# Patient Record
Sex: Female | Born: 1970 | Race: White | Hispanic: No | Marital: Married | Smoking: Never smoker
Health system: Southern US, Community
[De-identification: ages and names within clinical notes are randomized; demographics above are authoritative.]

## PROBLEM LIST (undated history)

## (undated) DIAGNOSIS — C4491 Basal cell carcinoma of skin, unspecified: Secondary | ICD-10-CM

## (undated) HISTORY — PX: TUBAL LIGATION: SHX77

## (undated) HISTORY — DX: Basal cell carcinoma of skin, unspecified: C44.91

---

## 2001-01-11 ENCOUNTER — Other Ambulatory Visit: Admission: RE | Admit: 2001-01-11 | Discharge: 2001-01-11 | Payer: Self-pay | Admitting: *Deleted

## 2002-01-22 ENCOUNTER — Other Ambulatory Visit: Admission: RE | Admit: 2002-01-22 | Discharge: 2002-01-22 | Payer: Self-pay | Admitting: Obstetrics and Gynecology

## 2002-06-12 ENCOUNTER — Ambulatory Visit (HOSPITAL_COMMUNITY): Admission: RE | Admit: 2002-06-12 | Discharge: 2002-06-12 | Payer: Self-pay | Admitting: Gastroenterology

## 2002-06-13 ENCOUNTER — Encounter (INDEPENDENT_AMBULATORY_CARE_PROVIDER_SITE_OTHER): Payer: Self-pay | Admitting: *Deleted

## 2002-06-13 ENCOUNTER — Ambulatory Visit (HOSPITAL_COMMUNITY): Admission: RE | Admit: 2002-06-13 | Discharge: 2002-06-13 | Payer: Self-pay | Admitting: Gastroenterology

## 2003-02-04 ENCOUNTER — Other Ambulatory Visit: Admission: RE | Admit: 2003-02-04 | Discharge: 2003-02-04 | Payer: Self-pay | Admitting: Obstetrics & Gynecology

## 2003-08-15 ENCOUNTER — Observation Stay (HOSPITAL_COMMUNITY): Admission: AD | Admit: 2003-08-15 | Discharge: 2003-08-16 | Payer: Self-pay | Admitting: Gynecology

## 2003-09-09 ENCOUNTER — Encounter (INDEPENDENT_AMBULATORY_CARE_PROVIDER_SITE_OTHER): Payer: Self-pay | Admitting: Specialist

## 2003-09-09 ENCOUNTER — Inpatient Hospital Stay (HOSPITAL_COMMUNITY): Admission: AD | Admit: 2003-09-09 | Discharge: 2003-09-12 | Payer: Self-pay | Admitting: Gynecology

## 2003-10-22 ENCOUNTER — Other Ambulatory Visit: Admission: RE | Admit: 2003-10-22 | Discharge: 2003-10-22 | Payer: Self-pay | Admitting: Gynecology

## 2004-07-16 ENCOUNTER — Emergency Department (HOSPITAL_COMMUNITY): Admission: EM | Admit: 2004-07-16 | Discharge: 2004-07-16 | Payer: Self-pay | Admitting: Emergency Medicine

## 2004-10-22 ENCOUNTER — Other Ambulatory Visit: Admission: RE | Admit: 2004-10-22 | Discharge: 2004-10-22 | Payer: Self-pay | Admitting: Gynecology

## 2005-08-30 ENCOUNTER — Other Ambulatory Visit: Admission: RE | Admit: 2005-08-30 | Discharge: 2005-08-30 | Payer: Self-pay | Admitting: Gynecology

## 2005-09-06 IMAGING — US US FETAL BPP W/O NONSTRESS
1 series · 14 of 22 positions shown · non-contrast
Comparison: none

CLINICAL DATA: UTI with contractions.  Assess fetal well-being.

[Series 1: unknown · 0.41mm/px · 14 of 22 slices shown]
[im 1/22]
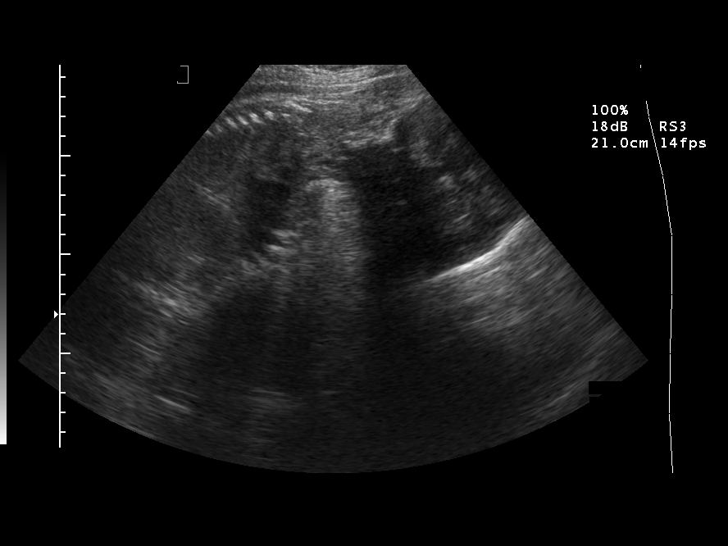
[im 3/22]
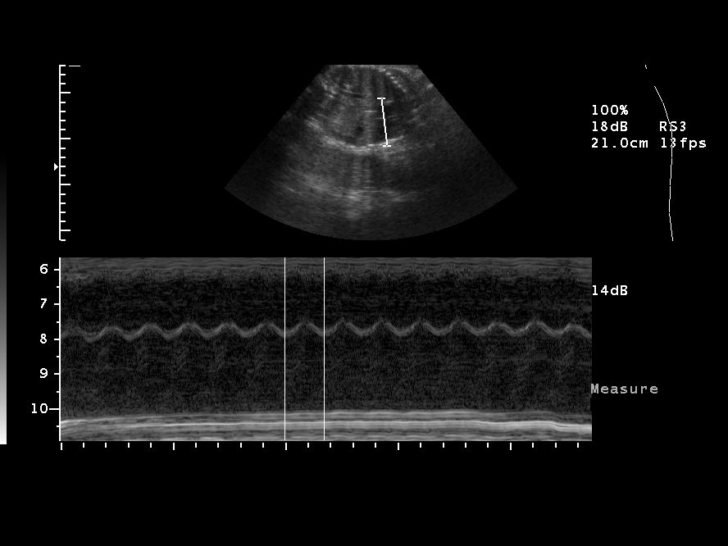
[im 4/22]
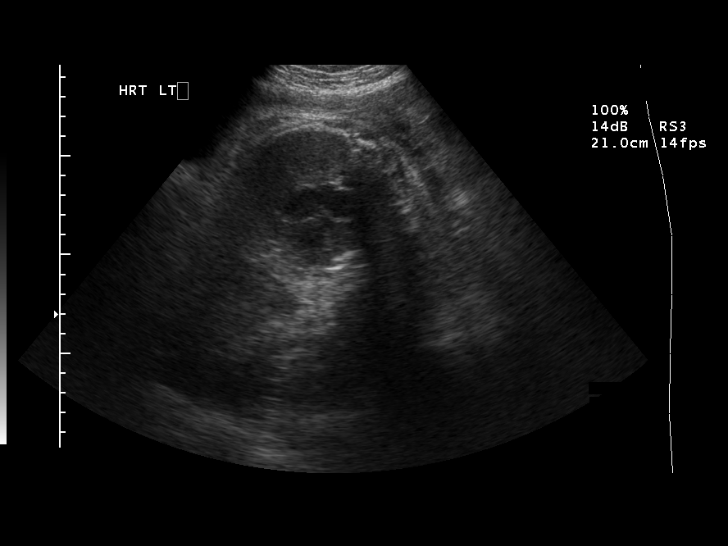
[im 6/22]
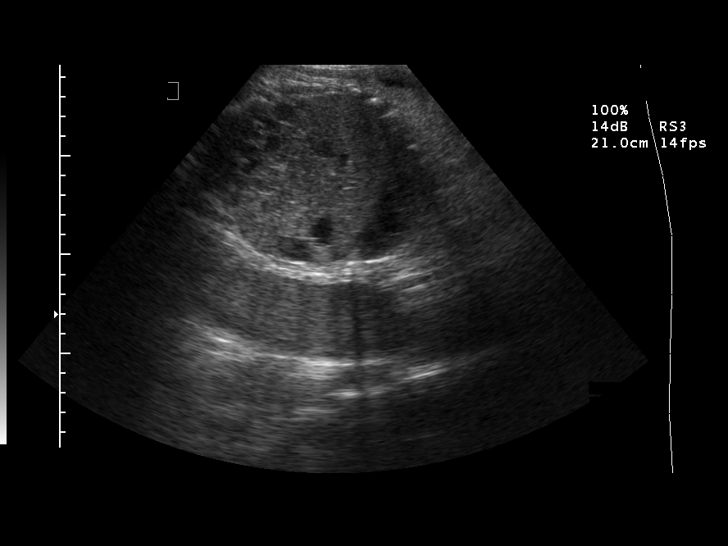
[im 8/22]
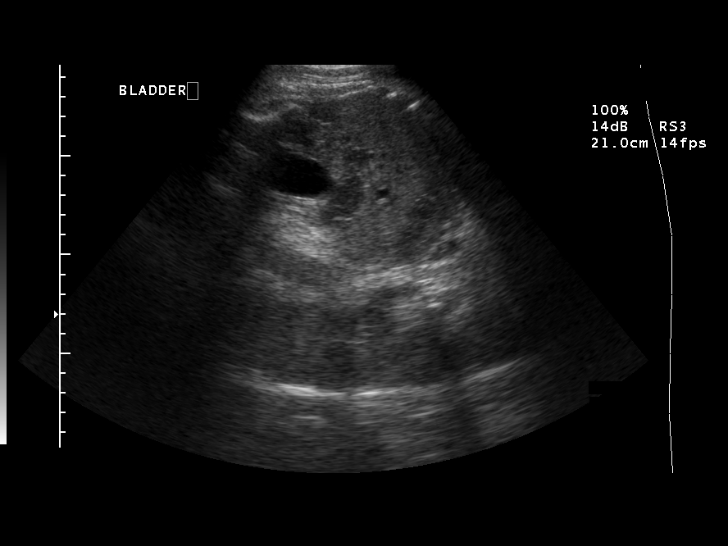
[im 9/22]
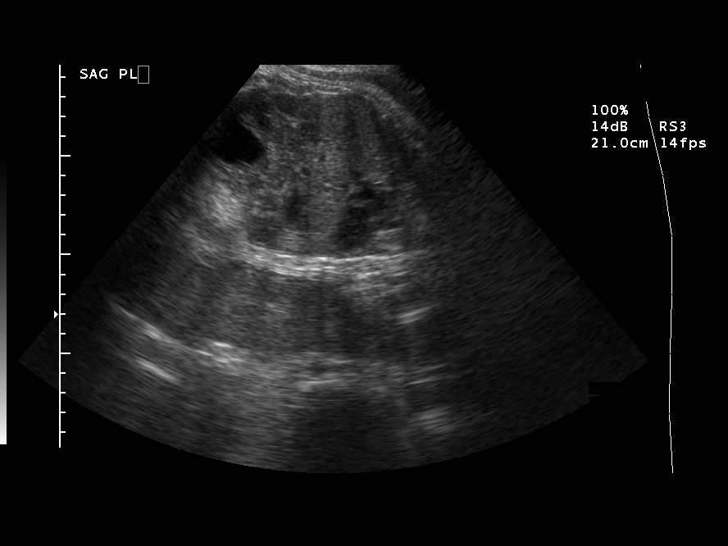
[im 11/22]
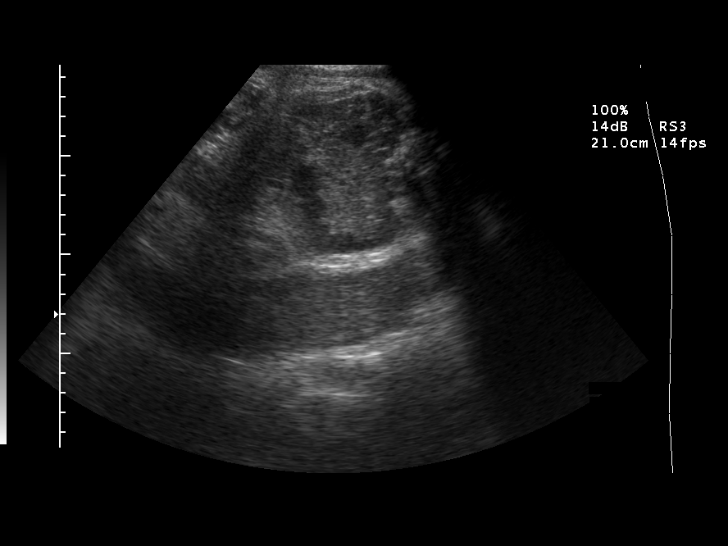
[im 12/22]
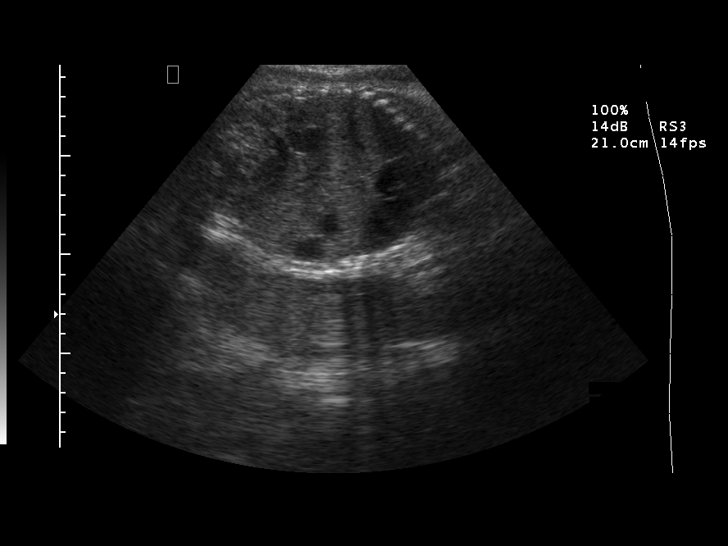
[im 14/22]
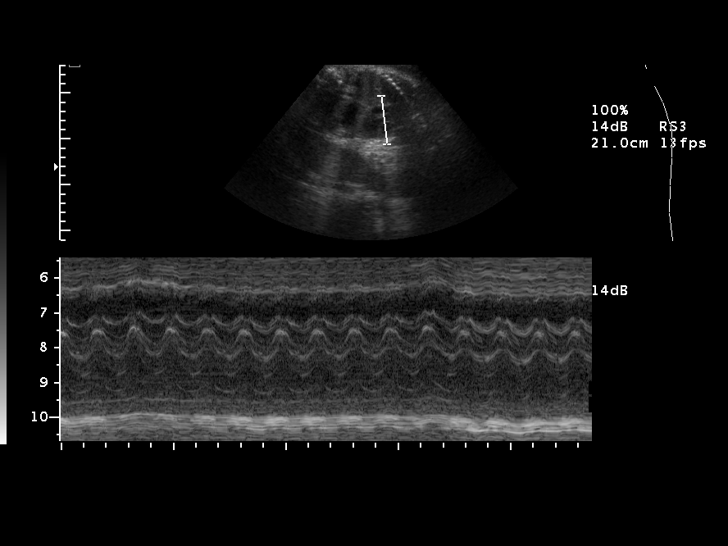
[im 15/22]
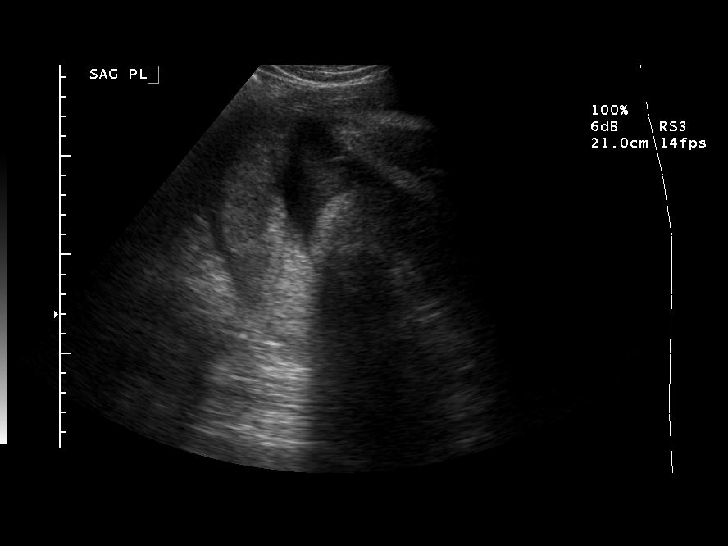
[im 17/22]
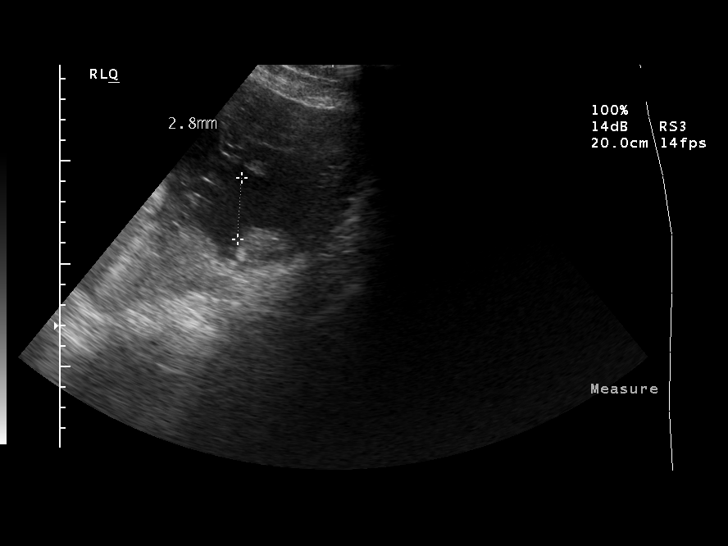
[im 19/22]
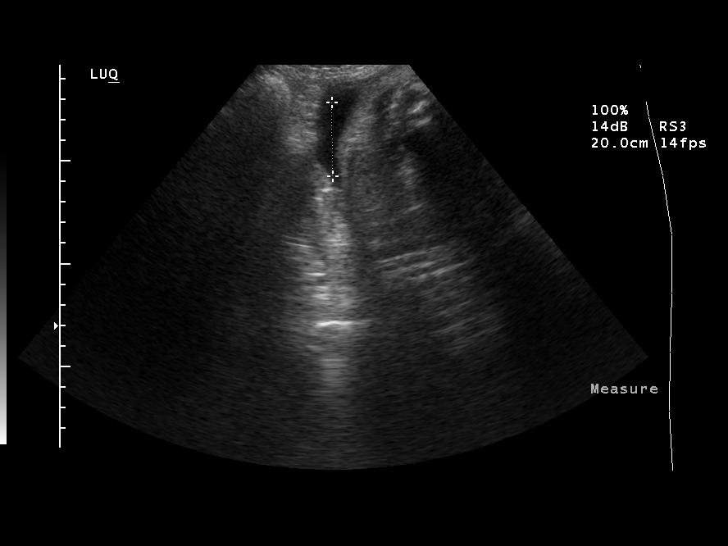
[im 20/22]
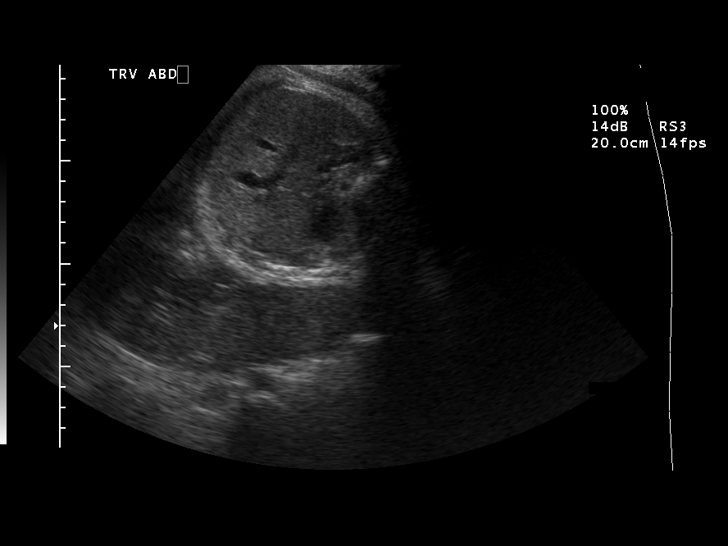
[im 22/22]
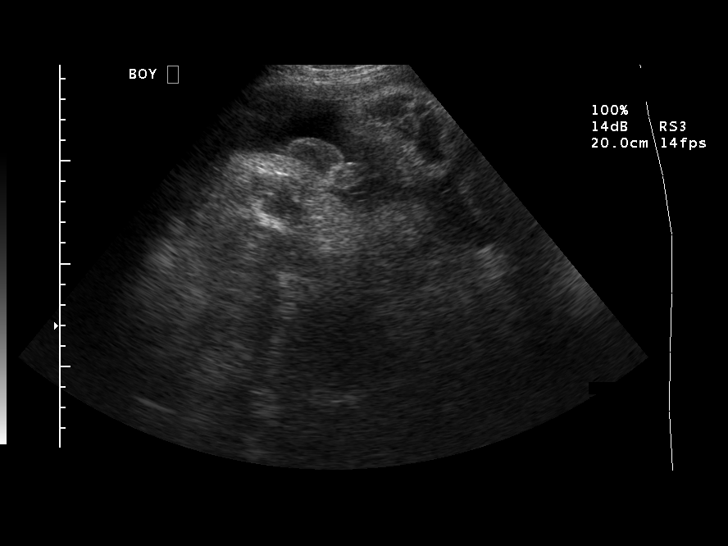

[14 of 22 positions shown; findings below may reference images not displayed]

BIOPHYSICAL PROFILE

Number of Fetuses:  1
Heart rate:  142
Movement:  Yes
Breathing: Yes
Presentation:  Cephalic
Placental Location:  Posterior
Grade:  II
Previa:  No
Amniotic Fluid (Subjective):  Normal
Amniotic Fluid (Objective):  12.0 cm AFI (5th -95th%ile =   7.7 ? 24.9 cm for 36 wks)

Fetal measurements and complete anatomic evaluation were not requested.  The following fetal anatomy was visualized on this exam:  Stomach, kidneys, bladder, diaphragm and male genitalia

BPP SCORING
Movements:  2  Time:  25 minutes
Breathing:  2
Tone:  2
Amniotic Fluid:  2
Total Score:  8

MATERNAL FINDINGS:
Cervix:  Not evaluated

IMPRESSION
Biophysical profile score [DATE].
Subjectively and quantitatively normal amniotic fluid.

## 2006-03-04 ENCOUNTER — Inpatient Hospital Stay (HOSPITAL_COMMUNITY): Admission: AD | Admit: 2006-03-04 | Discharge: 2006-03-07 | Payer: Self-pay | Admitting: Gynecology

## 2006-03-04 ENCOUNTER — Encounter (INDEPENDENT_AMBULATORY_CARE_PROVIDER_SITE_OTHER): Payer: Self-pay | Admitting: Specialist

## 2006-04-19 ENCOUNTER — Other Ambulatory Visit: Admission: RE | Admit: 2006-04-19 | Discharge: 2006-04-19 | Payer: Self-pay | Admitting: Gynecology

## 2006-04-29 ENCOUNTER — Encounter: Admission: RE | Admit: 2006-04-29 | Discharge: 2006-05-11 | Payer: Self-pay | Admitting: Gynecology

## 2007-04-21 ENCOUNTER — Other Ambulatory Visit: Admission: RE | Admit: 2007-04-21 | Discharge: 2007-04-21 | Payer: Self-pay | Admitting: Gynecology

## 2008-04-22 ENCOUNTER — Encounter: Payer: Self-pay | Admitting: Women's Health

## 2008-04-22 ENCOUNTER — Other Ambulatory Visit: Admission: RE | Admit: 2008-04-22 | Discharge: 2008-04-22 | Payer: Self-pay | Admitting: Gynecology

## 2008-04-22 ENCOUNTER — Ambulatory Visit: Payer: Self-pay | Admitting: Women's Health

## 2009-04-23 ENCOUNTER — Encounter: Payer: Self-pay | Admitting: Women's Health

## 2009-04-23 ENCOUNTER — Other Ambulatory Visit: Admission: RE | Admit: 2009-04-23 | Discharge: 2009-04-23 | Payer: Self-pay | Admitting: Gynecology

## 2009-04-23 ENCOUNTER — Ambulatory Visit: Payer: Self-pay | Admitting: Women's Health

## 2010-04-06 ENCOUNTER — Inpatient Hospital Stay (HOSPITAL_COMMUNITY): Admission: EM | Admit: 2010-04-06 | Discharge: 2010-04-10 | Payer: Self-pay | Admitting: Gastroenterology

## 2010-04-09 ENCOUNTER — Ambulatory Visit: Payer: Self-pay | Admitting: Infectious Disease

## 2010-04-16 ENCOUNTER — Encounter: Payer: Self-pay | Admitting: Infectious Disease

## 2010-04-30 ENCOUNTER — Ambulatory Visit: Payer: Self-pay | Admitting: Infectious Disease

## 2010-04-30 DIAGNOSIS — B259 Cytomegaloviral disease, unspecified: Secondary | ICD-10-CM | POA: Insufficient documentation

## 2010-04-30 DIAGNOSIS — M129 Arthropathy, unspecified: Secondary | ICD-10-CM | POA: Insufficient documentation

## 2010-04-30 DIAGNOSIS — K5289 Other specified noninfective gastroenteritis and colitis: Secondary | ICD-10-CM

## 2010-04-30 DIAGNOSIS — K5190 Ulcerative colitis, unspecified, without complications: Secondary | ICD-10-CM | POA: Insufficient documentation

## 2010-08-20 ENCOUNTER — Ambulatory Visit: Admit: 2010-08-20 | Payer: Self-pay | Admitting: Women's Health

## 2010-08-25 NOTE — Miscellaneous (Signed)
Summary: HIPAA Restrictions  HIPAA Restrictions   Imported By: Florinda Marker 05/01/2010 16:17:56  _____________________________________________________________________  External Attachment:    Type:   Image     Comment:   External Document

## 2010-08-25 NOTE — Assessment & Plan Note (Signed)
Summary: HFU/CMV Colitis/Sept ID Svc @ [facility]   Visit Type:  Follow-up Primary Nicholous Girgenti:  Dr. Loreta Ave  CC:  HFU/CMV Colitis.  History of Present Illness: 38-yo with Ulcerative colitis, dx in 2003 on chornic mesalamine, and more recently corticosteroids.  She had been on 60 mg of prednisone for 2 weeks prior to admission, and prior to that   had been on at least 30 mg of prednisone for several weeks.  She was admitted on September 12th with severe diarrhea and abdominal pain.   Colonoscopy was done which revealed severe pan colitis and large ulcers.  Biopsies were taken and the patient was started on corticosteroids therapy.  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 Her CMV igM came back positive along with CMV igg, CMV pCR blood negative. THe bx site had not been sent for viral cutlrue or pcr. She feels genearlly well and is having her prednisone tapered. She plans to return to work in December. She previously did work with young infants with developmental delay and was concerned about risk of exposing them to CMV. Apparently she will not be as involed in medically vulnerable pts in future. I also offered her flu shot but she refused this even though I explained that the inactive vaccine could not give her the flu and would protect her and her children (who do get the vaccine). She generally feels much better only having 3 bm per day that are smaller than what is normal for her. She has minimal abdominal pain. She had flare of arthiritc pain in knees a few weeks ago that she wonders might have been related to her UC.    Problems Prior to Update: None  Current Medications (verified): 1)  Valcyte 450 Mg Tabs (Valganciclovir Hcl) .... Two Tablets Three Times A Day 2)  Lialda 1.2 Gm Tbec (Mesalamine) 3)  Prednisone 10 Mg Tabs (Prednisone)  Allergies (verified): 1)  ! Flagyl   Preventive Screening-Counseling & Management  Alcohol-Tobacco     Alcohol  drinks/day: occassionally     Smoking Status: never  Caffeine-Diet-Exercise     Caffeine use/day: coffee,tea and sodas     Does Patient Exercise: yes     Type of exercise: walking and playing  with her boys outside  Safety-Violence-Falls     Seat Belt Use: yes   Current Allergies (reviewed today): ! FLAGYL Past History:  Past Medical History:  1. Ulceritive colitis in 2003.   2. Migraine headaches.   3. Prior history of Clostridium difficile colitis x1.  4. CMV colitis as described above  Past Surgical History: non recent surgeries  Family History: noncontirbutory  Social History: nonsmoker, reare alcohol. lives with husband and two young children  Review of Systems  The patient denies anorexia, fever, weight loss, weight gain, vision loss, decreased hearing, hoarseness, chest pain, syncope, dyspnea on exertion, peripheral edema, prolonged cough, headaches, hemoptysis, abdominal pain, melena, hematochezia, severe indigestion/heartburn, hematuria, incontinence, genital sores, muscle weakness, suspicious skin lesions, transient blindness, difficulty walking, depression, unusual weight change, abnormal bleeding, and enlarged lymph nodes.    Vital Signs:  Patient profile:   40 year old female Height:      60 inches (152.40 cm) Weight:      116.4 pounds (52.91 kg) BMI:     22.81 Temp:     98.2 degrees F (36.78 degrees C) oral Pulse rate:   102 / minute BP sitting:   93 / 64  (left arm)  Vitals  Entered By: Baxter Hire) (April 30, 2010 4:03 PM) CC: HFU/CMV Colitis Pain Assessment Patient in pain? no      Nutritional Status BMI of 19 -24 = normal Nutritional Status Detail appetite is good per patient  Does patient need assistance? Functional Status Self care Ambulation Normal   Physical Exam  General:  alert.   Head:  normocephalic, atraumatic, and no abnormalities observed.   Eyes:  vision grossly intact, pupils equal, and pupils round.   Ears:  no  external deformities and ear piercing(s) noted.   Nose:  no external deformity and no external erythema.   Mouth:  pharynx pink and moist and no erythema.   Neck:  supple and full ROM.   Lungs:  normal respiratory effort and no crackles.   Heart:  normal rate, regular rhythm, no murmur, no gallop, and no rub.   Abdomen:  protuberant minimal teenderness to palpation fo epigastric area, positive bs Msk:  normal ROM and no joint deformities.   Extremities:  No clubbing, cyanosis, edema, or deformity noted with normal full range of motion of all joints.   Neurologic:  alert & oriented X3, strength normal in all extremities, and gait normal.   Skin:  color normal and no rashes.   Psych:  Oriented X3, memory intact for recent and remote, and normally interactive.     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 Hoepfully this will not recur, though it is certainly possible, particulary if her immunosuppressives are intensified to treat the UC (which will likely happen again) Orders: Est. Patient Level IV (16109)  Problem # 2:  ULCERATIVE COLITIS (ICD-556.9) does not seem to be active at present though arthritic pain could have been an extraintestinal manifestaion Orders: Est. Patient Level IV (60454)  Problem # 3:  ARTHRITIS, KNEES, BILATERAL (ICD-716.98) this could have been extaintestianl manifestaions of UC  Medications Added to Medication List This Visit: 1)  Valcyte 450 Mg Tabs (Valganciclovir hcl) .... Two tablets three times a day 2)  Lialda 1.2 Gm Tbec (Mesalamine) 3)  Prednisone 10 Mg Tabs (Prednisone)

## 2010-10-07 NOTE — Discharge Summary (Signed)
  NAMESHAKENA, CALLARI              ACCOUNT NO.:  [id number]  MEDICAL RECORD NO.:  [id number]          PATIENT TYPE:  INP  LOCATION:  5041                         FACILITY:  [facility]  PHYSICIAN:  Anselmo Rod, MD, FACGDATE OF BIRTH:  08-Aug-1970  DATE OF ADMISSION:  04/06/2010 DATE OF DISCHARGE:  04/10/2010                              DISCHARGE SUMMARY   ADMITTING DIAGNOSES: 1. Severe ulcerative colitis flare-up. 2. Cytomegalovirus colitis. 3. Migraine headaches. 4. Past history of Clostridium difficile colitis superimposed on     ulcerative colitis.  DISCHARGE SUMMARY: 1. Severe ulcerative colitis. 2. Cytomegalovirus colitis. 3. Migraine headaches.  HOSPITAL COURSE:  Ms. Corona Popovich is a 40 year old Caucasian female, who was admitted from the office to [facility] for severe ulcerative colitis flare.  She was on a course of prednisone for several weeks and as her diarrhea did not get better she had an emergent colonoscopy that revealed pancolitis with friability, bleeding, and severe ulceration.  Therefore, she was hospitalized and Infectious Disease consultation was procured from Dr. Acey Lav on April 09, 2010.  At the time of admission, she was on Benadryl, Mesalamine, prednisone, Florastor, and morphine sulfate.  On Dr. Zenaida Niece Dam's recommendation, she was started on Valacyclovir 900 mg twice a day for a total of 21 days.  The patient's symptoms improved significantly prior to discharge and she was discharged home April 10, 2010.  DISCHARGE MEDICATIONS: 1. Valcyte 500 mg b.i.d. p.o. for the next 2 weeks, a total of 21     days. 2. Lialda 1.2 g two pills b.i.d. 3. Florastor 1 pill b.i.d. 4. Canasa suppositories 1000 mg at bedtime.  DISCHARGE INSTRUCTIONS: 1. The patient was advised to follow a low residue soft diet and avoid     all nonsteroidals. 2. Outpatient followup was advised on April 21, 2010.  The patient     is to call the  office if she had any problems in the interim.     Anselmo Rod, MD, Clementeen Graham     JNM/MEDQ  D:  10/05/2010  T:  10/06/2010  Job:  [id number]  Electronically Signed by Charna Elizabeth M.D. on 10/07/2010 05:12:16 PM

## 2010-10-08 LAB — CBC
HCT: 36.3 % (ref 36.0–46.0)
MCH: 28.5 pg (ref 26.0–34.0)
MCH: 29.3 pg (ref 26.0–34.0)
MCHC: 33.9 g/dL (ref 30.0–36.0)
MCV: 84.3 fL (ref 78.0–100.0)
MCV: 84.4 fL (ref 78.0–100.0)
MCV: 86.8 fL (ref 78.0–100.0)
Platelets: 203 10*3/uL (ref 150–400)
Platelets: 238 10*3/uL (ref 150–400)
Platelets: 249 10*3/uL (ref 150–400)
RBC: 3.18 MIL/uL — ABNORMAL LOW (ref 3.87–5.11)
RDW: 12.5 % (ref 11.5–15.5)
RDW: 12.6 % (ref 11.5–15.5)
RDW: 12.7 % (ref 11.5–15.5)
WBC: 5.5 10*3/uL (ref 4.0–10.5)

## 2010-10-08 LAB — BASIC METABOLIC PANEL
BUN: 2 mg/dL — ABNORMAL LOW (ref 6–23)
BUN: 4 mg/dL — ABNORMAL LOW (ref 6–23)
BUN: 4 mg/dL — ABNORMAL LOW (ref 6–23)
CO2: 27 mEq/L (ref 19–32)
CO2: 30 mEq/L (ref 19–32)
Calcium: 7.8 mg/dL — ABNORMAL LOW (ref 8.4–10.5)
Calcium: 8.1 mg/dL — ABNORMAL LOW (ref 8.4–10.5)
Chloride: 101 mEq/L (ref 96–112)
Chloride: 104 mEq/L (ref 96–112)
Creatinine, Ser: 0.42 mg/dL (ref 0.4–1.2)
Creatinine, Ser: 0.44 mg/dL (ref 0.4–1.2)
Creatinine, Ser: 0.52 mg/dL (ref 0.4–1.2)
GFR calc Af Amer: >60 mL/min (ref >60)
GFR calc Af Amer: >60 mL/min (ref >60)
GFR calc non Af Amer: >60 mL/min (ref >60)
Glucose, Bld: 103 mg/dL — ABNORMAL HIGH (ref 70–99)
Potassium: 2.9 mEq/L — ABNORMAL LOW (ref 3.5–5.1)

## 2010-10-08 LAB — HEPATIC FUNCTION PANEL
ALT: 18 U/L (ref 0–35)
Alkaline Phosphatase: 49 U/L (ref 39–117)
Bilirubin, Direct: 0.2 mg/dL (ref 0.0–0.3)
Indirect Bilirubin: 0.7 mg/dL (ref 0.3–0.9)
Total Protein: 5 g/dL — ABNORMAL LOW (ref 6.0–8.3)

## 2010-10-08 LAB — DIFFERENTIAL
Basophils Absolute: 0 10*3/uL (ref 0.0–0.1)
Eosinophils Absolute: 0.1 10*3/uL (ref 0.0–0.7)
Eosinophils Relative: 1 % (ref 0–5)
Lymphocytes Relative: 18 % (ref 12–46)
Lymphs Abs: 1.3 10*3/uL (ref 0.7–4.0)
Monocytes Absolute: 0.5 10*3/uL (ref 0.1–1.0)

## 2010-10-08 LAB — CYTOMEGALOVIRUS ANTIBODY, IGG: Cytomegalovirus(CMV) Antibody, IgG: POSITIVE — AB

## 2010-10-08 LAB — CMV IGM: CMV IgM: 2.21 — ABNORMAL HIGH (ref <0.90)

## 2010-10-08 LAB — HAEMOPHILIUS INFLUENZAE B AB IGG: Influenza B Virus Ab, IgG: 0.21 ug/mL — ABNORMAL LOW (ref >=1.00)

## 2010-12-11 NOTE — H&P (Signed)
Angel Dean, Angel Dean              ACCOUNT NO.:  [id number]   MEDICAL RECORD NO.:  [id number]          PATIENT TYPE:  INP   LOCATION:  9174                          FACILITY[facility]  [facility]   PHYSICIAN:  Timothy P. Fontaine, M.D.DATE OF BIRTH:  January 08, 1971   DATE OF ADMISSION:  03/04/2006  DATE OF DISCHARGE:                                HISTORY & PHYSICAL   CHIEF COMPLAINT:  Labor, rupture of membranes.   HISTORY OF PRESENT ILLNESS:  The patient is a 40 year old, G60, P59 female at  [redacted] weeks gestation admitted with spontaneous rupture of membranes and  subsequent contractions.  The patient's prenatal course was uncomplicated.  She does have a history of a prior cesarean section for non-reassuring fetal  heart rate tracing and after counseling to the risks and benefits, has  elected for a trial of labor.  She understands and accepts the risks of  catastrophic uterine rupture.  She is beta Strep negative.  For the  remainder of her history, see her __________ .   PHYSICAL EXAMINATION:  VITAL SIGNS:  Afebrile, vital signs stable.  HEENT:  Normal.  LUNGS:  Clear.  CARDIAC:  Regular rate without rubs, murmurs or gallops.  ABDOMEN:  Gravid fundus consistent with term.  External monitors show a  reactive fetal tracing with contractions every 2-4 minutes.  PELVIC:  Cervix fingertip dilated, 50% effaced, -2 station.  No gross fluid  noted, but on triage admission, nursing reports copious, clear fluid.  IUPC  was placed.   ASSESSMENT AND PLAN:  A 40 year old, G2, P1, 38 weeks, spontaneous rupture  of membranes, subsequent contractions, prior cesarean section.  Counseled  for options to include trial of labor versus repeat cesarean section, who  elects for trial of labor.  She has been previously counseled as to the  risks to include catastrophic uterine rupture.  She understands and accepts  these.  An intrauterine pressure catheter was placed.  Will assess for labor  and augment with pitocin as  needed as discussed with the patient, who  understands and accepts.  She is beta Strep negative.      Timothy P. Fontaine, M.D.  Electronically Signed     TPF/MEDQ  D:  03/04/2006  T:  03/04/2006  Job:  [id number]

## 2010-12-11 NOTE — Discharge Summary (Signed)
Angel Dean, Angel Dean              ACCOUNT NO.:  [id number]   MEDICAL RECORD NO.:  [id number]          PATIENT TYPE:  INP   LOCATION:  NA                            [facility]AC[facility]  [facility]   PHYSICIAN:  Juan H. Lily Peer, M.D.DATE OF BIRTH:  1971-04-28   DATE OF ADMISSION:  03/04/2006  DATE OF DISCHARGE:  03/07/2006                                 DISCHARGE SUMMARY   HISTORY OF PRESENT ILLNESS:  The patient is a 40 year old gravida 4, now  para 2, who had been admitted at [redacted] weeks gestation with spontaneous rupture  of membranes and in labor. The patient had a benign prenatal course. She had  a prior history of cesarean section for non-reassuring fetal heart rate  tracing and was attempting a trial of labor. She underwent a repeat lower  uterine segment transverse cesarean section along with a bilateral tubal  sterilization procedure secondary to prolonged rupture of membranes and  arrest of first stage of labor and had requested elective sterilization. She  delivered a viable female infant with Apgar's of 9 and 9 with a weight of 7  pounds, clear amniotic fluid, normal maternal pelvic anatomy. Postoperative  day 1, her hemoglobin was 10.6, platelet count 167,000. She had been  afebrile with good urinary output. Her blood type was O positive. She was  Rubella immune. Her diet was advanced from clear to a regular diet. On  postoperative day 2, she was passing flatus, was up and ambulating and  tolerated a regular diet, and she was ready to be discharged home on her  third postoperative day. Her skin incision had been intact and it had been  closed with subcuticular stitches and she was ready to be discharged home.   FINAL DIAGNOSES:  1. Term intrauterine pregnancy, delivered.  2. Prolonged rupture of membranes.  3. Arrest of first stage of labor.  4. Prior cesarean section, for trial of labor.  5. Request of elective permanent sterilization.   PROCEDURES:  1. Fetal monitoring.  2. Pitocin  augmentation.  3. Repeat lower uterine segment transverse cesarean section.  4. Bilateral tubal sterilization procedure by primary technique.   DISPOSITION:  The patient will be discharged home on her third postoperative  day. She was up ambulating, tolerating a regular diet well, and was passing  flatus. She had complained of some oral thrush and upon discharge, she was  given a prescription for Nystatin oral suspension, to do oral swishes 3  times a day. She was given a  prescription for Tylox to take 1 p.o. q.4 to 6 hours p.r.n. pain. She was  instructed to continue her prenatal vitamins and iron.   FOLLOWUP:  She is to followup in the office in 6 weeks for postpartum visit.      Juan H. Lily Peer, M.D.  Electronically Signed     JHF/MEDQ  D:  03/30/2006  T:  03/30/2006  Job:  [id number]

## 2010-12-11 NOTE — Discharge Summary (Signed)
NAMEGERALYNN, CAPRI                        ACCOUNT NO.:  [id number]   MEDICAL RECORD NO.:  [id number]                   PATIENT TYPE:  INP   LOCATION:  9176                                 FACILITY:  [facility]   PHYSICIAN:  Ivor Costa. Farrel Gobble, M.D.              DATE OF BIRTH:  Aug 26, 1970   DATE OF ADMISSION:  08/15/2003  DATE OF DISCHARGE:  08/16/2003                                 DISCHARGE SUMMARY   DISCHARGE DIAGNOSES:  1. Intrauterine pregnancy at 36 weeks 4 days undelivered on admission.  2. Pyelonephritis.  3. Fetal tachycardia thought to be secondary to same.   HISTORY:  This is a 32-years-of-age female gravida 1 with an EDC of September 07, 2002.  Prenatal course prior to admission had only been complicated by  ulcerative colitis.  The patient presented on August 15, 2003 to office  complaining of nausea and vomiting since the previous p.m. numerous times  and lower back pain.  She was seen in the office, diagnosed with UTI versus  early pyelonephritis, 18-20 wbc's, 2+ bacteria, and the patient was sent to  triage for IV fluids, antiemetics, and antibiotics.   HOSPITAL COURSE:  On August 15, 2003 the patient was seen in triage at 36  weeks 4 days with nausea and vomiting, lower back pain, early  pyelonephritis, and actually, a nonstress test showed decreased long-term  variability with tachycardia in the 160s to 170s rate and contractions every  2 minutes.  BPP was ordered, IV fluids were continued, and the patient was  changed to antepartum/labor and delivery.  It was discussed if the patient  went into labor, thought at that point may try Procardia.  BPP showed 8/8  with signs of abruption and amniocentesis was ordered to rule out infection  and check maturity.  She continued to contract every 2 minutes with  increased fetal heart rate and decreased variability.  Amniocentesis and  ultrasound guidance on August 15, 2003 revealed blood-tinged amniotic  fluid, was sent for  glucose, LDH, gram stain and culture.  The patient  tolerated well.  By that p.m. the patient was feeling better.  Gram stain  showed no organism, no wbc's.  Chemistry and L/S and PG were still pending.  Fetal heart rate was 180s with contractions still every 2 minutes.  Vaginal  exam was a dimple and firm.  O2 was placed.  Discussed with the patient, was  not to have tocolysis.  By 11:45 on August 15, 2003 the patient was feeling  better, decreased baseline to 140s with periods of accelerations, increased  long-term variability, and contractions were variable.  At that point the  diagnosis of early pyelonephritis with fetal tachycardia which seemed to be  resolved.  There was no evidence of fetal infection.  On August 16, 2003  the patient was feeling much better, lower back pain better, no  contractions; she was afebrile.  Nonstress test revealed  the heart rate to  be 150-160 with small accelerations, no decelerations, and rare  contractions.  BPP was ordered which was within normal limits.  Therefore,  the patient was thought stable for discharge.   ACCESSORY CLINICAL FINDINGS/LABORATORY DATA:  On August 15, 2003 white  count was 14,100; hemoglobin was 12.7.  On August 15, 2003 fetal lung  maturity was 44.8.  On August 15, 2003 of amniotic fluid, protein was less  than 3, glucose was 28.  On August 15, 2003 amniotic fluid gram stain  showed no wbc's, no organisms, no growth in 3 days.   DISPOSITION:  The patient is discharged to home, was placed on Zithromax 250  b.i.d., she had already been placed on Augmentin, and she was to follow in  the office on Monday with a BPP and nonstress test.  If she had problem  prior to that time to be seen in the office.     Susa Loffler, P.A.                    Ivor Costa. Farrel Gobble, M.D.    Ardath Sax  D:  09/16/2003  T:  09/16/2003  Job:  [id number]

## 2010-12-11 NOTE — Discharge Summary (Signed)
NAME:  Angel Dean, Angel Dean                        ACCOUNT NO.:  [id number]   MEDICAL RECORD NO.:  [id number]                   PATIENT TYPE:  INP   LOCATION:  9106                                 FACILITY:  [facility]   PHYSICIAN:  Ivor Costa. Farrel Gobble, M.D.              DATE OF BIRTH:  Aug 31, 1970   DATE OF ADMISSION:  09/09/2003  DATE OF DISCHARGE:  09/12/2003                                 DISCHARGE SUMMARY   DISCHARGE DIAGNOSES:  1. Intrauterine pregnancy at term.  2. Primary cesarean section for nonreassuring fetal heart tones.   PROCEDURE:  Primary cesarean section, delivery of a viable female.   HISTORY OF PRESENT ILLNESS:  A 40 year old, gravida 1, para 0 at 40 weeks  who presented in labor.  Prenatal course was uncomplicated. She does have a  history of ulcerative colitis.  She did  well in the prenatal course.   PRENATAL LABS:  He prenatal labs, blood type was O positive, antibody  negative, serology nonreactive, rubella titer positive, hepatitis  nonreactive, HIV nonreactive and group B strep negative.   HOSPITAL COURSE:  The patient presented on February 14 with contractions and  labor. She progressed to about 5 cm. She did have a nonreassuring fetal  heart tone and after counseling she had a primary cesarean section and a  delivery of a viable female with Apgar of 9 & 9 with a birth weight of 6  pounds and 10 ounces.  Postpartum, she did well. voiding remained afebrile  and was discharged home in satisfactory condition on her third postoperative  day.   DISCHARGE LABORATORIES:  Her white count was 10.4, hemoglobin 10.7,  hematocrit 30.4 and platelets 155.   DISPOSITION:  She was discharged home with instructions to followup in six  weeks, continue prenatal vitamins and iron. Prescription for Tylox was  given.  _________ discharge booklet.  Followup in six weeks or as needed.  Steri-Strips were intact to incision.     Davonna Belling. Young, N.P.                      Ivor Costa. Farrel Gobble,  M.D.    Providence Lanius  D:  10/25/2003  T:  10/26/2003  Job:  [id number]

## 2010-12-11 NOTE — Op Note (Signed)
NAMEAMARISE, Angel Dean              ACCOUNT NO.:  [id number]   MEDICAL RECORD NO.:  [id number]          PATIENT TYPE:  INP   LOCATION:  9144                          FACILITY:  [facility]   PHYSICIAN:  Juan H. Lily Peer, M.D.DATE OF BIRTH:  09/13/1970   DATE OF PROCEDURE:  03/04/2006  DATE OF DISCHARGE:                                 OPERATIVE REPORT   INDICATIONS FOR OPERATION:  A 40 year old gravida 2, para 1 at 37-1/[redacted] weeks  gestation who was admitted at 0200 hours today, complaining of spontaneous  rupture of membranes.  She was augmented with Pitocin.  Patient has a prior  history of cesarean section for fetal distress, requesting a trial of labor  and also requested postpartum tubal ligation.  The patient in arrest of  cervical dilatation at 3 cm, 80% and -3 station; despite active labor and  reactive fetal heart rate tracing.   PREOPERATIVE DIAGNOSES:  1. Term intrauterine pregnancy.  2. Prolonged rupture of membranes.  3. Arrest of first stage of labor.  4. Prior cesarean section, for trial labor.  5. Request for elective bilateral tubal sterilization procedure.   POSTOPERATIVE DIAGNOSES:  1. Term intrauterine pregnancy.  2. Prolonged rupture of membrane.  3. Arrest of first stage of labor.  4. Prior cesarean section.  5. Request for elective bilateral tubal sterilization procedure.   ANESTHESIA:  Epidural.   PROCEDURE PERFORMED:  Repeat lower uterine segment transverse cesarean  section, along with bilateral tubal sterilization, Pomeroy technique.   FINDINGS:  Viable female infant, Apgars of 9 and 9; with a weight of 7 pounds.  Clear amniotic fluid.  Normal maternal pelvic anatomy.   DESCRIPTION OF OPERATION:  After the patient was adequately counseled, she  was taken to the operating room where she was redosed through epidural  catheter.  The abdomen was prepped and draped in the usual sterile fashion.  Foley catheter was in place to monitor urinary output.  A  Pfannenstiel skin incision was made 2 cm above the symphysis pubis,  adjacent to the previous Pfannenstiel scar.  The incision was carried out  through the skin, subcutaneous tissues and down to the rectus fascia; where  a midline nick was made.  The fascia was incised in a transverse fashion.  The peritoneal cavity was entered cautiously and the bladder flap was  established.  The lower uterine segment was incised in a transverse fashion.  Clear amniotic fluid was present.  The newborn's head was delivered and was  suctioned with a bulb syringe.  The newborn was delivered, gave an immediate  cry.  The cord was doubly clamped and excised; shown to the parents and  passed off to the neonatologist  -- who were in attendance given the above-  mentioned parameters.  After cord blood was obtained, the placenta was  passed off the operative field.  The patient was a public cord blood donor.  The uterus was then exteriorized due to some uterine atony, and 0.2 mg IM  was administered as a uterotonic agent.  The uterine cavity was swept clear  of remaining products of conception.  The  uterus was closed in a double  layered fashion; the first one a locking stitch of 0 Vicryl suture and the  second followed by an imbricating stitch of 0 Vicryl suture as well.   The patient's proximal right fallopian tube, one-third distance from the  uterotubal junction, the tube was grasped with a Babcock clamp.  A 2 cm  section was excised and suture ligated with 2-0 Vicryl suture x2.  A similar  procedure was carried out on the contralateral side.  The uterus was then  placed back in the pelvic cavity.  The pelvic cavity was copiously  irrigated, after ascertaining adequate hemostasis.  Sponge and needle count  were correct, closure was started.  The visceral peritoneum was not , but  the rectus fascia was closed with running stitch of 0 Vicryl suture.  Subcutaneous bleeders were Bovie cauterized and the skin was  reapproximated  with a subcuticular stitch with a Keith needle, consisting of 3-0 Vicryl  suture.  Steri-Strips were then placed and then followed by a pressure  dressing.  The patient was transferred to the recovery room with stable  vital signs.  Blood loss from procedure was 500 cc; IV fluid 3 liters.  Urine output 200 cc and clear.      Juan H. Lily Peer, M.D.  Electronically Signed     JHF/MEDQ  D:  03/04/2006  T:  03/04/2006  Job:  [id number]

## 2010-12-11 NOTE — Op Note (Signed)
Angel Dean, DOMINIK                          ACCOUNT NO.:  [id number]   MEDICAL RECORD NO.:  [id number]                   PATIENT TYPE:  AMB   LOCATION:  ENDO                                 FACILITY:  [facility]   PHYSICIAN:  Anselmo Rod, M.D.               DATE OF BIRTH:  May 31, 1971   DATE OF PROCEDURE:  06/12/2002  DATE OF DISCHARGE:                                 OPERATIVE REPORT   PROCEDURE PERFORMED:  Colonoscopy changed to a flexible sigmoidoscopy.   ENDOSCOPIST:  Charna Elizabeth, M.D.   INSTRUMENT USED:  Pediatric adjustable Olympus colonoscope.   INDICATIONS FOR PROCEDURE:  A 40 year old white female with a history of  ulcerative colitis.  A colonoscopy is being done to evaluate extent of  disease.  The patient had rectal bleeding and milk white stools with  diarrhea and had a known history of ulcerative colitis.  The patient is not  compliant with the medications and has only consumed half a bottle of  MiraLax today.   PREPROCEDURE PREPARATION:  Informed consent was procured from the patient.  The patient was fasted for eight hours prior to the procedure and prepped  with a bottle of Gatorade and a bottle of MiraLax the night prior to the  procedure (she only consumed half the recommended dosage).   PREPROCEDURE PHYSICAL:   GENERAL:  The patient has stable vital signs.   NECK:  Supple.   CHEST:  Clear to auscultation.  S1, S2 regular.   ABDOMEN:  Soft with normal bowel sounds.   DESCRIPTION OF PROCEDURE:  The patient was placed in the left lateral  decubitus position and sedated with 70 mg of Demerol and 7 mg of Versed  intravenously.  Once the patient was adequately sedated and maintained on  low-flow oxygen and continuous cardiac monitoring, the Olympus video  colonoscope was advanced from the rectum to 15 cm with difficulty because of  solid stool in the rectosigmoid area and the procedure was aborted at this  point.  There was severe inflammation and  ulceration seen in the mucosa from  the anus to 15 cm with friability and bleeding.  Biopsies were not done.   IMPRESSION:  1. Severe colitis seen from zero to 15 cm.  2. Large amount of solid stool at 15 cm.  Procedure was aborted at this     point.    RECOMMENDATIONS:  The patient is being reprepped with a gallon of NuLytely  tonight and will be scoped again in the morning.  An ESR will also be drawn  and extent of disease evaluated.                                               Anselmo Rod, M.D.    JNM/MEDQ  D:  06/12/2002  T:  06/13/2002  Job:  [id number]   cc:   Sung Amabile. Roslyn Smiling, M.D.  [address]  [address]  [place]  [place] [address]  Fax: [phone]

## 2010-12-11 NOTE — Op Note (Signed)
Angel Dean, Angel Dean                          ACCOUNT NO.:  [id number]   MEDICAL RECORD NO.:  [id number]                   PATIENT TYPE:  AMB   LOCATION:  ENDO                                 FACILITY:  [facility]   PHYSICIAN:  Anselmo Rod, M.D.               DATE OF BIRTH:  1971-02-11   DATE OF PROCEDURE:  06/13/2002  DATE OF DISCHARGE:                                 OPERATIVE REPORT   PROCEDURE:  Colonoscopy with biopsies.   ENDOSCOPIST:  Anselmo Rod, M.D.   INSTRUMENT USED:  Adjustable pediatric Olympus colonoscope.   INDICATION FOR PROCEDURE:  A 40 year old white female with a history of  ulcerative colitis.  The patient has not had a baseline colonoscopy for  several years.  Therefore, colonoscopy is being done to evaluate extent of  disease.  The patient presents with frequent stool, rectal bleeding, and  diarrhea in spite of ongoing use of sulfasalazine.   PREPROCEDURE PREPARATION:  Informed consent was procured from the patient.  The patient had fasted for eight hours prior to the procedure and prepped  with a bottle of magnesium citrate and a gallon of NuLytely the night prior  to the procedure.   PREPROCEDURE PHYSICAL:  VITAL SIGNS:  The patient had stable vital signs  except for slight tachycardia with a heart rate ranging from 150-160 beats  per minute.  The patient's tachycardia improved with sedation.  NECK:  Supple.  CHEST:  Clear to auscultation.  S1, S2 regular.  ABDOMEN:  Soft with normal bowel sounds.   DESCRIPTION OF PROCEDURE:  The patient was placed in the left lateral  decubitus position and sedated with 100 mg of Demerol and 10 mg of Versed  intravenously.  Once the patient was adequately sedate and maintained on low-  flow oxygen and continuous cardiac monitoring, the Olympus video colonoscope  was advanced from the rectum to the cecum and terminal ileum without  difficulty.  There was some residual stool in the colon.  Multiple washes  were  done.  There was severe inflammatory change with friability from 0-30  cm.  Scattered pseudopolyps were also seen.  Multiple biopsies were done for  pathology.  An isolated polyp (very small) was seen in the cecum.  This was  not biopsied.  The terminal ileum appeared normal without lesions.   IMPRESSION:  1. Severe inflammatory change from 0-30 cm.  2. Normal-appearing colonic mucosa beyond 30 cm up to the cecum except for a     small isolated polyp in the cecum.  3. Normal terminal ileum.   RECOMMENDATIONS:  1. Await pathology results.  2.     Continue sulfasalazine.  3. Trial of Rowasa enemas.  4. Outpatient follow-up in the next two weeks for further recommendations.  Anselmo Rod, M.D.    JNM/MEDQ  D:  06/13/2002  T:  06/13/2002  Job:  [id number]   cc:   [facility] Derek Jack, N.P.   Sung Amabile. Roslyn Smiling, M.D.  [address]  [address]  [place]  [place] [address]  Fax: [phone]

## 2010-12-11 NOTE — Op Note (Signed)
NAME:  Angel Dean, Angel Dean                        ACCOUNT NO.:  [id number]   MEDICAL RECORD NO.:  [id number]                   PATIENT TYPE:  INP   LOCATION:  9108                                 FACILITY:  [facility]   PHYSICIAN:  Timothy P. Fontaine, M.D.           DATE OF BIRTH:  01-04-1971   DATE OF PROCEDURE:  09/09/2003  DATE OF DISCHARGE:                                 OPERATIVE REPORT   PREOPERATIVE DIAGNOSES:  1. Pregnancy at term.  2. Non-reassuring fetal tracing.   POSTOPERATIVE DIAGNOSES:  1. Pregnancy at term.  2. Non-reassuring fetal tracing.   PROCEDURE:  Primary low transverse cervical cesarean section.   SURGEON:  Timothy P. Fontaine, M.D.   ASSISTANT:  Scrub technician.   ANESTHETIC:  Epidural.   ESTIMATED BLOOD LOSS:  Less than 500 cc.   COMPLICATIONS:  None.   SPECIMENS:  1. Samples of cord blood.  2. Placenta.   FINDINGS:  At 57, normal female, Apgars 9 and 9, questionable undescended  testes noted by perinatologist.  Pelvic anatomy was noted to be normal.   DESCRIPTION OF PROCEDURE:  The patient was taken to the operating room and  underwent dosing of her epidural catheter, was placed in the left tilt  supine position, received an abdominal preparation with Betadine solution,  having had a Foley previously placed during labor. The patient was draped in  the usual fashion.  After assuring adequate anesthesia, the abdomen was  sharply entered through a Pfannenstiel incision achieving adequate  hemostasis at all levels.  The bladder flap was sharply and bluntly  developed without difficulty, and the uterus was sharply entered in the  lower uterine segment and bluntly extended laterally.  The bulging membranes  were ruptured, the fluid noted to be thin meconium-stained.  The infant's  head was delivered through the incision with the assistance of the vacuum  extractor, the infant suctioned with the DeLee catheter and bulb suction.  Subsequently, the rest of  the infant was delivered, the cord doubly clamped  and cut, and the infant handed to pediatrics in attendance.  Samples of cord  blood were obtained.  The placenta was then spontaneously extruded and noted  to be intact.  The placenta was sent to pathology due to the meconium  staining and non-reassuring fetal tracing.  No cord pH was done due to the  Apgars of 9 and 9.  The uterus was exteriorized, the endometrial cavity  explored with a sponge to remove all placental and membrane fragments.  The  uterine incision was then closed in one layer using 0 Vicryl suture in a  running interlocking stitch.  The uterus was returned to the abdomen which  was copiously irrigated noting adequate hemostasis.  The patient received 1  g of Cefotan prophylaxis at this time.  The anterior fascia was then  reapproximated using 0 Vicryl suture in a running stitch, the subcutaneous  tissues irrigated, hemostasis achieved with  electrocautery, the skin  reapproximated using 4-0 Vicryl in a running subcuticular stitch, Steri-  Strips with Benzoin applied, a sterile dressing applied, and the patient  taken to the recovery room in good condition having tolerated the procedure  well.                                               Timothy P. Audie Box, M.D.    TPF/MEDQ  D:  09/09/2003  T:  09/09/2003  Job:  [id number]

## 2011-04-26 ENCOUNTER — Other Ambulatory Visit: Payer: Self-pay | Admitting: Internal Medicine

## 2011-04-26 ENCOUNTER — Other Ambulatory Visit: Payer: Self-pay | Admitting: Dermatology

## 2011-04-26 DIAGNOSIS — C4491 Basal cell carcinoma of skin, unspecified: Secondary | ICD-10-CM

## 2011-04-26 HISTORY — PX: SKIN CANCER EXCISION: SHX779

## 2011-04-26 HISTORY — DX: Basal cell carcinoma of skin, unspecified: C44.91

## 2011-05-26 ENCOUNTER — Other Ambulatory Visit: Payer: Self-pay | Admitting: Dermatology

## 2011-07-22 ENCOUNTER — Encounter: Payer: Self-pay | Admitting: Women's Health

## 2011-08-25 ENCOUNTER — Encounter: Payer: Self-pay | Admitting: Women's Health

## 2011-08-27 ENCOUNTER — Other Ambulatory Visit (HOSPITAL_COMMUNITY)
Admission: RE | Admit: 2011-08-27 | Discharge: 2011-08-27 | Disposition: A | Discharge status: Home / Self Care | Payer: BC Managed Care – PPO | Source: Ambulatory Visit | Attending: Obstetrics and Gynecology | Admitting: Obstetrics and Gynecology

## 2011-08-27 ENCOUNTER — Encounter: Payer: Self-pay | Admitting: Women's Health

## 2011-08-27 ENCOUNTER — Ambulatory Visit (INDEPENDENT_AMBULATORY_CARE_PROVIDER_SITE_OTHER): Payer: BC Managed Care – PPO | Admitting: Women's Health

## 2011-08-27 VITALS — BP 110/70 | Ht 60.25 in | Wt 114.0 lb

## 2011-08-27 DIAGNOSIS — C4491 Basal cell carcinoma of skin, unspecified: Secondary | ICD-10-CM | POA: Insufficient documentation

## 2011-08-27 DIAGNOSIS — Z01419 Encounter for gynecological examination (general) (routine) without abnormal findings: Secondary | ICD-10-CM

## 2011-08-27 NOTE — Progress Notes (Signed)
Angel Dean 03-06-1971 [id number]    History:    The patient presents for annual exam.  Monthly 4-5 day cycle/BTL with no complaints. History of ulcerative colitis labs and meds per Dr. Loreta Ave. History normal Paps last Pap October 2010.   Past medical history, past surgical history, family history and social history were all reviewed and documented in the EPIC chart. Son's [name] age 41 with ADHD, [name] 5 doing well.   ROS:  A  ROS was performed and pertinent positives and negatives are included in the history.  Exam:  Filed Vitals:   08/27/11 1205  BP: 110/70    General appearance:  Normal Head/Neck:  Normal, without cervical or supraclavicular adenopathy. Thyroid:  Symmetrical, normal in size, without palpable masses or nodularity. Respiratory  Effort:  Normal  Auscultation:  Clear without wheezing or rhonchi Cardiovascular  Auscultation:  Regular rate, without rubs, murmurs or gallops  Edema/varicosities:  Not grossly evident Abdominal  Soft,nontender, without masses, guarding or rebound.  Liver/spleen:  No organomegaly noted  Hernia:  None appreciated  Skin  Inspection:  Grossly normal  Palpation:  Grossly normal Neurologic/psychiatric  Orientation:  Normal with appropriate conversation.  Mood/affect:  Normal  Genitourinary    Breasts: Examined lying and sitting.     Right: Without masses, retractions, discharge or axillary adenopathy.     Left: Without masses, retractions, discharge or axillary adenopathy.   Inguinal/mons:  Normal without inguinal adenopathy  External genitalia:  Normal  BUS/Urethra/Skene's glands:  Normal  Bladder:  Normal  Vagina:  Normal  Cervix:  Normal  Uterus:  normal in size, shape and contour.  Midline and mobile  Adnexa/parametria:     Rt: Without masses or tenderness.   Lt: Without masses or tenderness.  Anus and perineum: Normal  Digital rectal exam:   Assessment/Plan:  41 y.o. M WF G2 P2 for annual exam.    Normal GYN  exam/BTL Ulcerative colitis-Dr. Loreta Ave labs and meds Basal cell carcinoma 10/12   Plan: Pap only. Had normal labs December 2012. SBE's, annual mammogram will schedule. Calcium rich foods encouraged, exercise, vitamin D 2000 daily encouraged. Continue scheduled followup with dermatologist for skin surveillance.    Harrington Challenger WHNP, 1:03 PM 08/27/2011

## 2012-02-04 ENCOUNTER — Other Ambulatory Visit: Payer: Self-pay | Admitting: Obstetrics and Gynecology

## 2012-02-04 DIAGNOSIS — Z1231 Encounter for screening mammogram for malignant neoplasm of breast: Secondary | ICD-10-CM

## 2012-02-18 ENCOUNTER — Ambulatory Visit: Payer: BC Managed Care – PPO

## 2012-02-28 ENCOUNTER — Ambulatory Visit
Admission: RE | Admit: 2012-02-28 | Discharge: 2012-02-28 | Disposition: A | Discharge status: Home / Self Care | Payer: BC Managed Care – PPO | Source: Ambulatory Visit | Attending: Obstetrics and Gynecology | Admitting: Obstetrics and Gynecology

## 2012-02-28 DIAGNOSIS — Z1231 Encounter for screening mammogram for malignant neoplasm of breast: Secondary | ICD-10-CM

## 2012-02-29 ENCOUNTER — Emergency Department (HOSPITAL_COMMUNITY)
Admission: EM | Admit: 2012-02-29 | Discharge: 2012-02-29 | Disposition: A | Discharge status: Home / Self Care | Payer: BC Managed Care – PPO | Attending: Emergency Medicine | Admitting: Emergency Medicine

## 2012-02-29 ENCOUNTER — Encounter (HOSPITAL_COMMUNITY): Payer: Self-pay | Admitting: *Deleted

## 2012-02-29 DIAGNOSIS — W268XXA Contact with other sharp object(s), not elsewhere classified, initial encounter: Secondary | ICD-10-CM | POA: Insufficient documentation

## 2012-02-29 DIAGNOSIS — Z1810 Retained metal fragments, unspecified: Secondary | ICD-10-CM | POA: Insufficient documentation

## 2012-02-29 DIAGNOSIS — Z23 Encounter for immunization: Secondary | ICD-10-CM | POA: Insufficient documentation

## 2012-02-29 DIAGNOSIS — IMO0002 Reserved for concepts with insufficient information to code with codable children: Secondary | ICD-10-CM

## 2012-02-29 DIAGNOSIS — S61209A Unspecified open wound of unspecified finger without damage to nail, initial encounter: Secondary | ICD-10-CM | POA: Insufficient documentation

## 2012-02-29 MED ORDER — TETANUS-DIPHTHERIA TOXOIDS TD 5-2 LFU IM INJ
0.5000 mL | INJECTION | Freq: Once | INTRAMUSCULAR | Status: AC
  Administered 2012-02-29: 0.5 mL via INTRAMUSCULAR
  Filled 2012-02-29: qty 0.5

## 2012-02-29 MED ORDER — NAPROXEN 250 MG PO TABS
375.0000 mg | ORAL_TABLET | Freq: Once | ORAL | Status: DC
  Filled 2012-02-29: qty 2

## 2012-02-29 NOTE — ED Notes (Signed)
To ED for eval after catching her left first finger in clippers. Small lac noted. Bleeding controlled.

## 2012-02-29 NOTE — ED Notes (Signed)
Naproxen given to pharmacist.

## 2012-02-29 NOTE — ED Provider Notes (Signed)
History     CSN: [id number]  Arrival date & time 02/29/12  4098   First MD Initiated Contact with Patient 02/29/12 2128      Chief Complaint  Patient presents with  . Finger Injury    (Consider location/radiation/quality/duration/timing/severity/associated sxs/prior treatment) HPI Comments: She was clipping her head just when she accidentally caught the distal end of her left index finger in October.  She has a small avulsion, laceration, palmar surface of the distal tip of her index, finger  The history is provided by the patient.    Past Medical History  Diagnosis Date  . Ulcerative colitis   . Basal cell carcinoma 04/2011    stomach removed    Past Surgical History  Procedure Date  . Tubal ligation   . Skin cancer excision 04/2011    Family History  Problem Relation Age of Onset  . Cancer Mother 49    LYMPHOMA  . Heart disease Maternal Grandfather   . Heart disease Paternal Grandmother   . Breast cancer Paternal Grandmother   . Cancer Father     skin  . Thyroid disease Father   . Cancer Maternal Grandmother     skin    History  Substance Use Topics  . Smoking status: Never Smoker   . Smokeless tobacco: Never Used  . Alcohol Use: Yes     social    OB History    Grav Para Term Preterm Abortions TAB SAB Ect Mult Living   2 2        2       Review of Systems  Constitutional: Negative for fever and chills.  Gastrointestinal: Negative for nausea.  Musculoskeletal: Negative for joint swelling.  Skin: Positive for wound.  Neurological: Negative for dizziness and weakness.    Allergies  Metronidazole  Home Medications  No current outpatient prescriptions on file.  BP 108/70  Pulse 87  Temp 97.8 F (36.6 C) (Oral)  Resp 16  SpO2 100%  Physical Exam  Constitutional: She appears well-developed and well-nourished.  Neck: Normal range of motion.  Cardiovascular: Normal rate.   Pulmonary/Chest: Effort normal.  Neurological: She is alert.    Skin: Skin is warm.       Small avulsion, laceration to the palmar surface of the distal tip of her left index finger.  Without active bleeding.  Neurologically intact.  Full range of motion    ED Course  LACERATION REPAIR Date/Time: 02/29/2012 9:48 PM Performed by: Arman Filter Authorized by: Arman Filter Consent: Verbal consent obtained. Consent given by: patient Patient understanding: patient states understanding of the procedure being performed Patient identity confirmed: verbally with patient Time out: Immediately prior to procedure a "time out" was called to verify the correct patient, procedure, equipment, support staff and site/side marked as required. Body area: upper extremity Location details: left index finger Foreign bodies: metal Tendon involvement: none Nerve involvement: none Vascular damage: no Irrigation solution: saline Skin closure: glue Patient tolerance: Patient tolerated the procedure well with no immediate complications.   (including critical care time)  Labs Reviewed - No data to display Mm Digital Screening  02/28/2012  *RADIOLOGY REPORT*  Clinical Data: Screening.  DIGITAL BILATERAL SCREENING MAMMOGRAM WITH CAD  Comparison:  no previous exams  Findings: The breast tissue is extremely dense. No suspicious masses, architectural distortion, or calcifications are present.  Images were processed with CAD.  IMPRESSION: No mammographic evidence of malignancy.  A result letter of this screening mammogram will be mailed directly  to the patient.  RECOMMENDATION: Screening mammogram in one year. (Code:SM-B-01Y)  BI-RADS CATEGORY 1:  Negative  Original Report Authenticated By: Harrel Lemon, M.D.     No diagnosis found.    MDM   Wound was repaired through a Dermabond tetanus status was updated        Arman Filter, NP 02/29/12 2149  Arman Filter, NP 02/29/12 2232

## 2012-03-01 NOTE — ED Provider Notes (Signed)
Medical screening examination/treatment/procedure(s) were performed by non-physician practitioner and as supervising physician I was immediately available for consultation/collaboration.  Aluel Schwarz L Melodye Swor, MD 03/01/12 0130 

## 2013-05-07 ENCOUNTER — Other Ambulatory Visit: Payer: Self-pay

## 2013-05-07 DIAGNOSIS — Z1231 Encounter for screening mammogram for malignant neoplasm of breast: Secondary | ICD-10-CM

## 2013-05-30 ENCOUNTER — Other Ambulatory Visit (HOSPITAL_COMMUNITY)
Admission: RE | Admit: 2013-05-30 | Discharge: 2013-05-30 | Disposition: A | Discharge status: Home / Self Care | Payer: BC Managed Care – PPO | Source: Ambulatory Visit | Attending: Gynecology | Admitting: Gynecology

## 2013-05-30 ENCOUNTER — Encounter: Payer: Self-pay | Admitting: Women's Health

## 2013-05-30 ENCOUNTER — Ambulatory Visit (INDEPENDENT_AMBULATORY_CARE_PROVIDER_SITE_OTHER): Payer: BC Managed Care – PPO | Admitting: Women's Health

## 2013-05-30 VITALS — BP 108/66 | Ht 60.5 in | Wt 114.6 lb

## 2013-05-30 DIAGNOSIS — C4491 Basal cell carcinoma of skin, unspecified: Secondary | ICD-10-CM

## 2013-05-30 DIAGNOSIS — Z01419 Encounter for gynecological examination (general) (routine) without abnormal findings: Secondary | ICD-10-CM

## 2013-05-30 DIAGNOSIS — Z13220 Encounter for screening for lipoid disorders: Secondary | ICD-10-CM

## 2013-05-30 DIAGNOSIS — Z833 Family history of diabetes mellitus: Secondary | ICD-10-CM

## 2013-05-30 LAB — CBC WITH DIFFERENTIAL/PLATELET
Basophils Relative: 0 % (ref 0–1)
Eosinophils Absolute: 0.8 10*3/uL — ABNORMAL HIGH (ref 0.0–0.7)
Eosinophils Relative: 13 % — ABNORMAL HIGH (ref 0–5)
Lymphs Abs: 1.8 10*3/uL (ref 0.7–4.0)
MCH: 29 pg (ref 26.0–34.0)
MCHC: 34.5 g/dL (ref 30.0–36.0)
MCV: 84 fL (ref 78.0–100.0)
Neutrophils Relative %: 52 % (ref 43–77)
Platelets: 226 10*3/uL (ref 150–400)
RDW: 12.5 % (ref 11.5–15.5)

## 2013-05-30 LAB — COMPREHENSIVE METABOLIC PANEL
ALT: 11 U/L (ref 0–35)
Alkaline Phosphatase: 45 U/L (ref 39–117)
CO2: 26 mEq/L (ref 19–32)
Creat: 0.53 mg/dL (ref 0.50–1.10)
Total Bilirubin: 0.6 mg/dL (ref 0.3–1.2)

## 2013-05-30 LAB — LIPID PANEL
Cholesterol: 148 mg/dL (ref 0–200)
LDL Cholesterol: 77 mg/dL (ref 0–99)
Total CHOL/HDL Ratio: 2.4 Ratio
Triglycerides: 50 mg/dL (ref <150)
VLDL: 10 mg/dL (ref 0–40)

## 2013-05-30 NOTE — Addendum Note (Signed)
Addended by: Richardson Chiquito on: 05/30/2013 04:10 PM   Modules accepted: Orders

## 2013-05-30 NOTE — Progress Notes (Signed)
Angel Dean 1970/11/06 [id number]    History:    The patient presents for annual exam.  Regular monthly cycles/BTL .Normal Pap and mammogram history.  Basal cell skin cancer 2012. Ulcerative colitis Dr. Loreta Ave remission for past 3 years/doing better.   Past medical history, past surgical history, family history and social history were all reviewed and documented in the EPIC chart. Early intervention for at risk children. Angel Dean 9 ADHD, Angel Dean 6 doing well. Father skin cancer. Mother lymphoma.   ROS:  A  ROS was performed and pertinent positives and negatives are included in the history.  Exam:  Filed Vitals:   05/30/13 1145  BP: 108/66    General appearance:  Normal Head/Neck:  Normal, without cervical or supraclavicular adenopathy. Thyroid:  Symmetrical, normal in size, without palpable masses or nodularity. Respiratory  Effort:  Normal  Auscultation:  Clear without wheezing or rhonchi Cardiovascular  Auscultation:  Regular rate, without rubs, murmurs or gallops  Edema/varicosities:  Not grossly evident Abdominal  Soft,nontender, without masses, guarding or rebound.  Liver/spleen:  No organomegaly noted  Hernia:  None appreciated  Skin  Inspection:  Grossly normal  Palpation:  Grossly normal Neurologic/psychiatric  Orientation:  Normal with appropriate conversation.  Mood/affect:  Normal  Genitourinary    Breasts: Examined lying and sitting.     Right: Without masses, retractions, discharge or axillary adenopathy.     Left: Without masses, retractions, discharge or axillary adenopathy.   Inguinal/mons:  Normal without inguinal adenopathy  External genitalia:  Normal  BUS/Urethra/Skene's glands:  Normal  Bladder:  Normal  Vagina:  Normal  Cervix:  Normal  Uterus:   normal in size, shape and contour.  Midline and mobile  Adnexa/parametria:     Rt: Without masses or tenderness.   Lt: Without masses or tenderness.  Anus and perineum: Normal  Digital rectal  exam: Normal sphincter tone without palpated masses or tenderness  Assessment/Plan:  42 y.o. MWF G2P2 for annual exam with no complaints.  Ulcerative colitis-Dr. Loreta Ave in remission Basal cell skin cancer 2012 Normal GYN exam/BTL  Plan: SBE's, continue annual mammogram, reviewed and encouraged 3 D tomography, history of dense breasts. Continue regular exercise, calcium rich diet, vitamin D 1000 daily. Reviewed importance of scheduling dermatology appointment and annual skin checks. CBC, glucose, lipid panel, UA, Pap. Pap normal 2012, new screening guidelines reviewed.Marland Kitchen    Glena Pharris J WHNP, 1:20 PM 05/30/2013

## 2013-05-30 NOTE — Patient Instructions (Signed)

## 2013-05-31 ENCOUNTER — Ambulatory Visit
Admission: RE | Admit: 2013-05-31 | Discharge: 2013-05-31 | Disposition: A | Discharge status: Home / Self Care | Payer: BC Managed Care – PPO | Source: Ambulatory Visit

## 2013-05-31 ENCOUNTER — Other Ambulatory Visit: Payer: Self-pay

## 2013-05-31 DIAGNOSIS — Z1231 Encounter for screening mammogram for malignant neoplasm of breast: Secondary | ICD-10-CM

## 2013-05-31 LAB — URINALYSIS W MICROSCOPIC + REFLEX CULTURE
Bilirubin Urine: NEGATIVE
Ketones, ur: NEGATIVE mg/dL
Protein, ur: NEGATIVE mg/dL
Urobilinogen, UA: 0.2 mg/dL (ref 0.0–1.0)

## 2013-06-01 ENCOUNTER — Other Ambulatory Visit: Payer: Self-pay | Admitting: Women's Health

## 2013-06-01 DIAGNOSIS — R928 Other abnormal and inconclusive findings on diagnostic imaging of breast: Secondary | ICD-10-CM

## 2013-06-01 MED ORDER — SULFAMETHOXAZOLE-TRIMETHOPRIM 800-160 MG PO TABS: 1.0000 | ORAL_TABLET | Freq: Two times a day (BID) | ORAL | Status: DC

## 2013-06-02 LAB — URINE CULTURE

## 2013-06-04 ENCOUNTER — Encounter: Payer: Self-pay | Admitting: Gynecology

## 2013-06-05 ENCOUNTER — Other Ambulatory Visit: Payer: Self-pay | Admitting: Women's Health

## 2013-06-05 ENCOUNTER — Telehealth: Payer: Self-pay

## 2013-06-05 DIAGNOSIS — N390 Urinary tract infection, site not specified: Secondary | ICD-10-CM

## 2013-06-05 NOTE — Telephone Encounter (Signed)
Allergy and hives as reaction abstracted in patient's chart.

## 2013-06-05 NOTE — Telephone Encounter (Signed)
Yes, please add Septra as an allergy and note hives is  reaction. Thanks

## 2013-06-05 NOTE — Telephone Encounter (Signed)
Message copied by Keenan Bachelor on Tue Jun 05, 2013 10:44 AM ------      Message from: [place], [place] J      Created: Fri Jun 01, 2013  5:01 PM       Left message on cell positive UTI, Septra Rx called into pharmacy.            Please call and inform CBC, CMet, lipid panel, Pap normal. Make sure she received message that she had a UTI. Have her check a test of cure UA in 2 weeks if she was asymptomatic. ------

## 2013-06-05 NOTE — Telephone Encounter (Signed)
I called patient. See below.  She wanted you to know that she took the Septra and finished it last night. She woke in the night with itching and hives on her torso.  She got some Benadryl and HC Cream and has used that. She thought we might want to note the Septra as a drug she is allergic to.

## 2013-06-18 ENCOUNTER — Ambulatory Visit: Payer: BC Managed Care – PPO

## 2013-06-18 DIAGNOSIS — N390 Urinary tract infection, site not specified: Secondary | ICD-10-CM

## 2013-06-19 LAB — URINALYSIS W MICROSCOPIC + REFLEX CULTURE
Hgb urine dipstick: NEGATIVE
Nitrite: NEGATIVE
Protein, ur: NEGATIVE mg/dL
Urobilinogen, UA: 0.2 mg/dL (ref 0.0–1.0)

## 2013-06-25 ENCOUNTER — Ambulatory Visit
Admission: RE | Admit: 2013-06-25 | Discharge: 2013-06-25 | Disposition: A | Discharge status: Home / Self Care | Payer: BC Managed Care – PPO | Source: Ambulatory Visit | Attending: Women's Health | Admitting: Women's Health

## 2013-06-25 DIAGNOSIS — R928 Other abnormal and inconclusive findings on diagnostic imaging of breast: Secondary | ICD-10-CM

## 2013-12-11 ENCOUNTER — Other Ambulatory Visit: Payer: Self-pay | Admitting: Dermatology

## 2014-05-27 ENCOUNTER — Encounter: Payer: Self-pay | Admitting: Women's Health

## 2014-07-29 ENCOUNTER — Other Ambulatory Visit: Payer: Self-pay

## 2014-07-29 DIAGNOSIS — Z1231 Encounter for screening mammogram for malignant neoplasm of breast: Secondary | ICD-10-CM

## 2014-08-07 ENCOUNTER — Ambulatory Visit
Admission: RE | Admit: 2014-08-07 | Discharge: 2014-08-07 | Disposition: A | Discharge status: Home / Self Care | Payer: BC Managed Care – PPO | Source: Ambulatory Visit

## 2014-08-07 DIAGNOSIS — Z1231 Encounter for screening mammogram for malignant neoplasm of breast: Secondary | ICD-10-CM

## 2014-08-12 ENCOUNTER — Encounter: Payer: Self-pay | Admitting: Women's Health

## 2015-06-23 IMAGING — MG MM DIGITAL SCREENING BILAT
4 series · 4 of 4 positions shown · non-contrast
Comparison: Previous Exam(s)

CLINICAL DATA: Screening.

EXAM:
DIGITAL SCREENING BILATERAL MAMMOGRAM WITH CAD

[L CC]
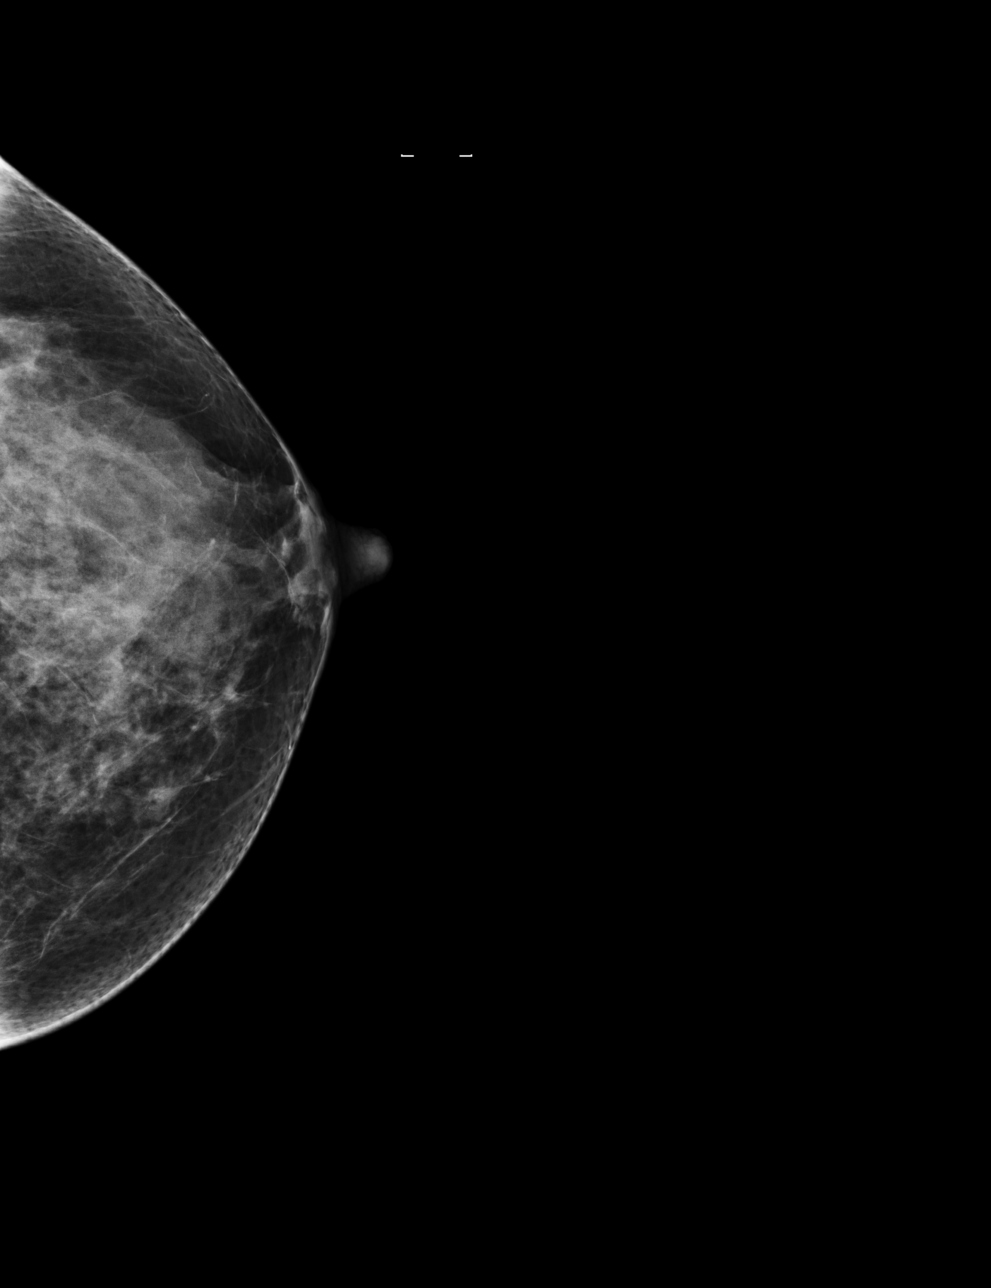

[R CC]
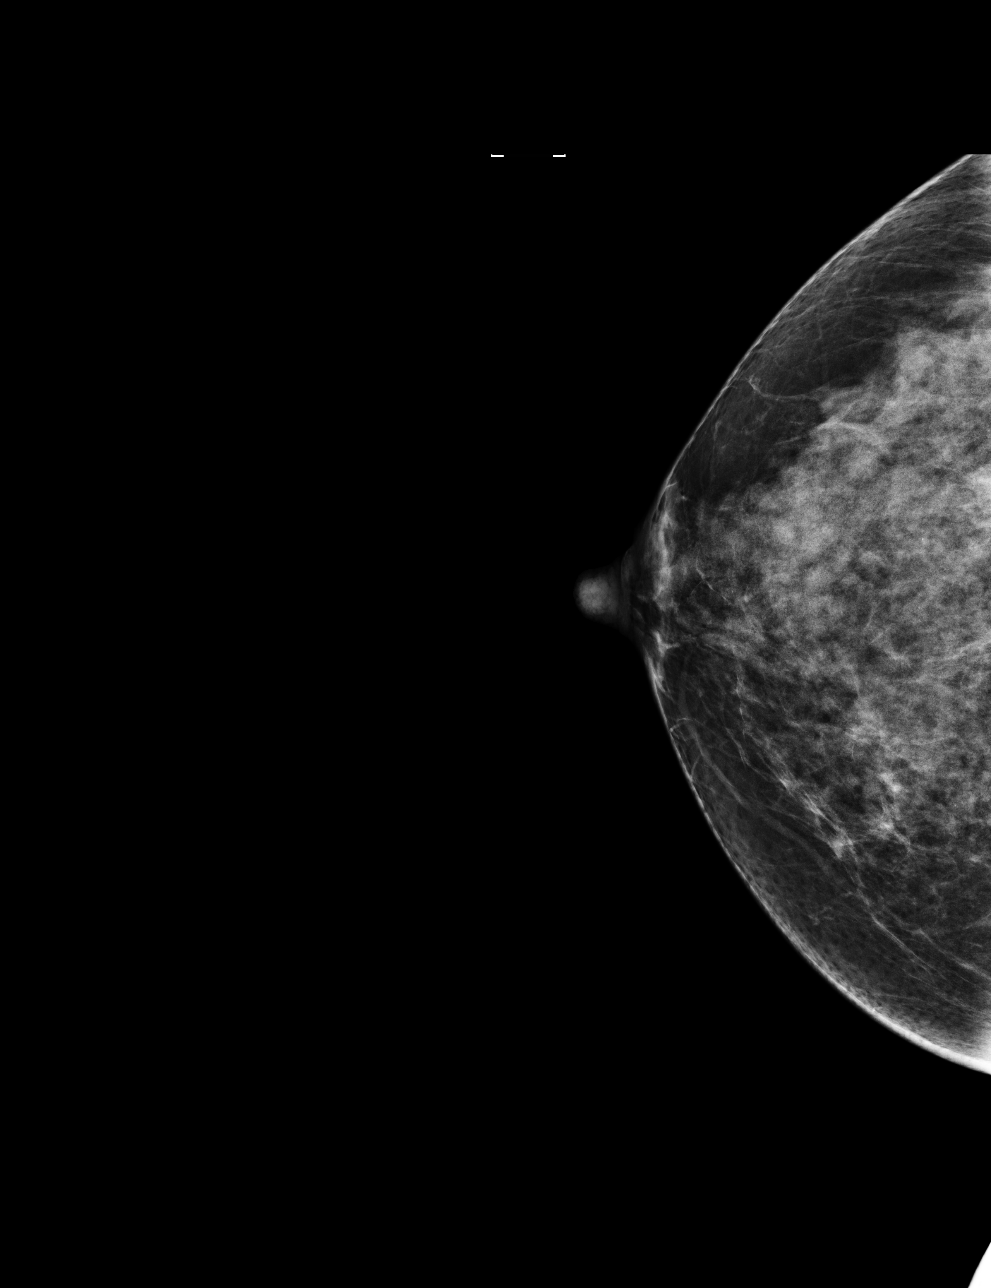

[R MLO]
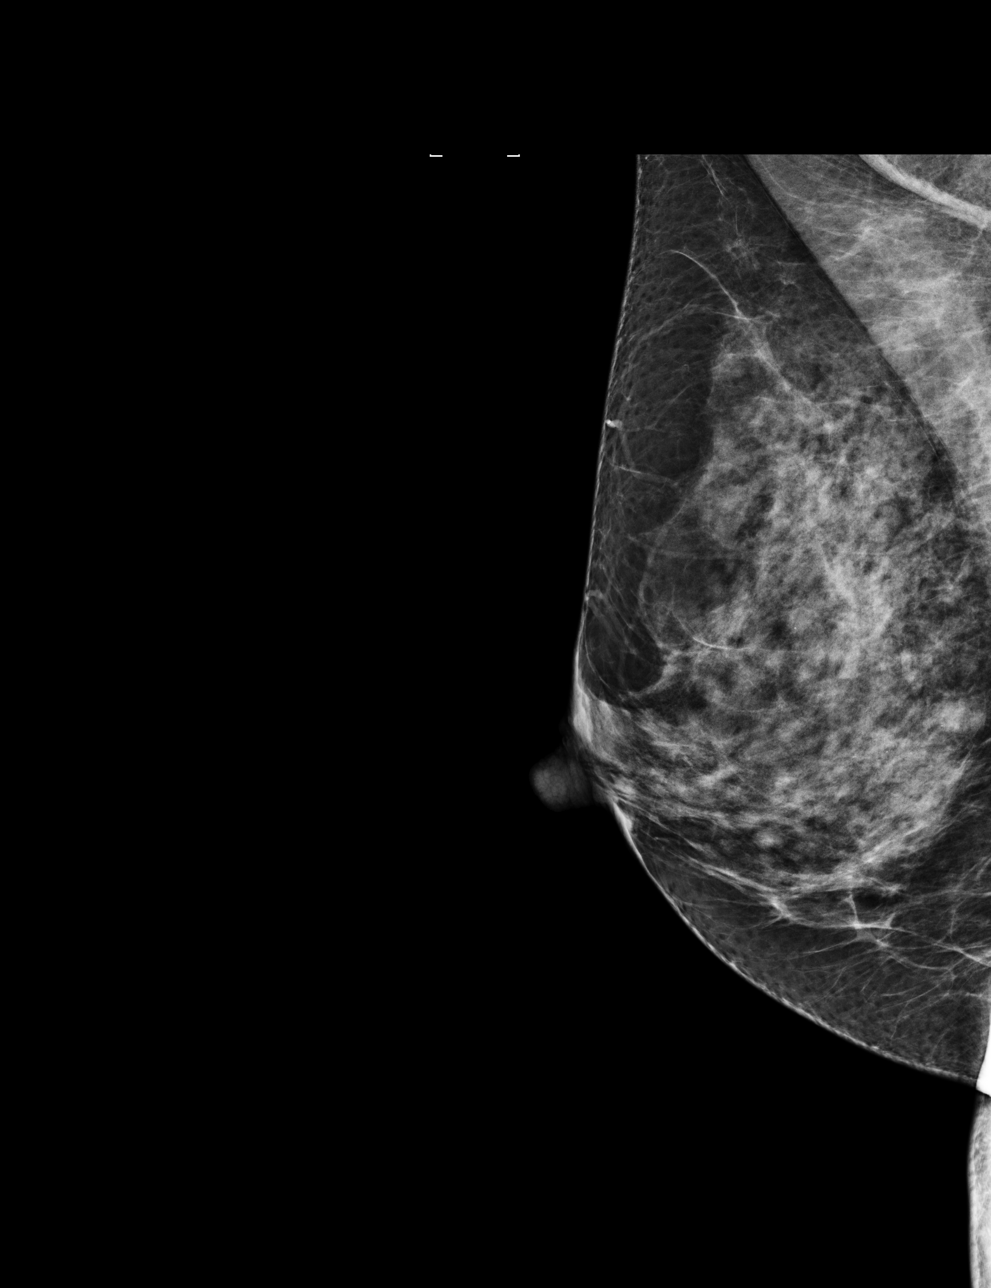

[L MLO]
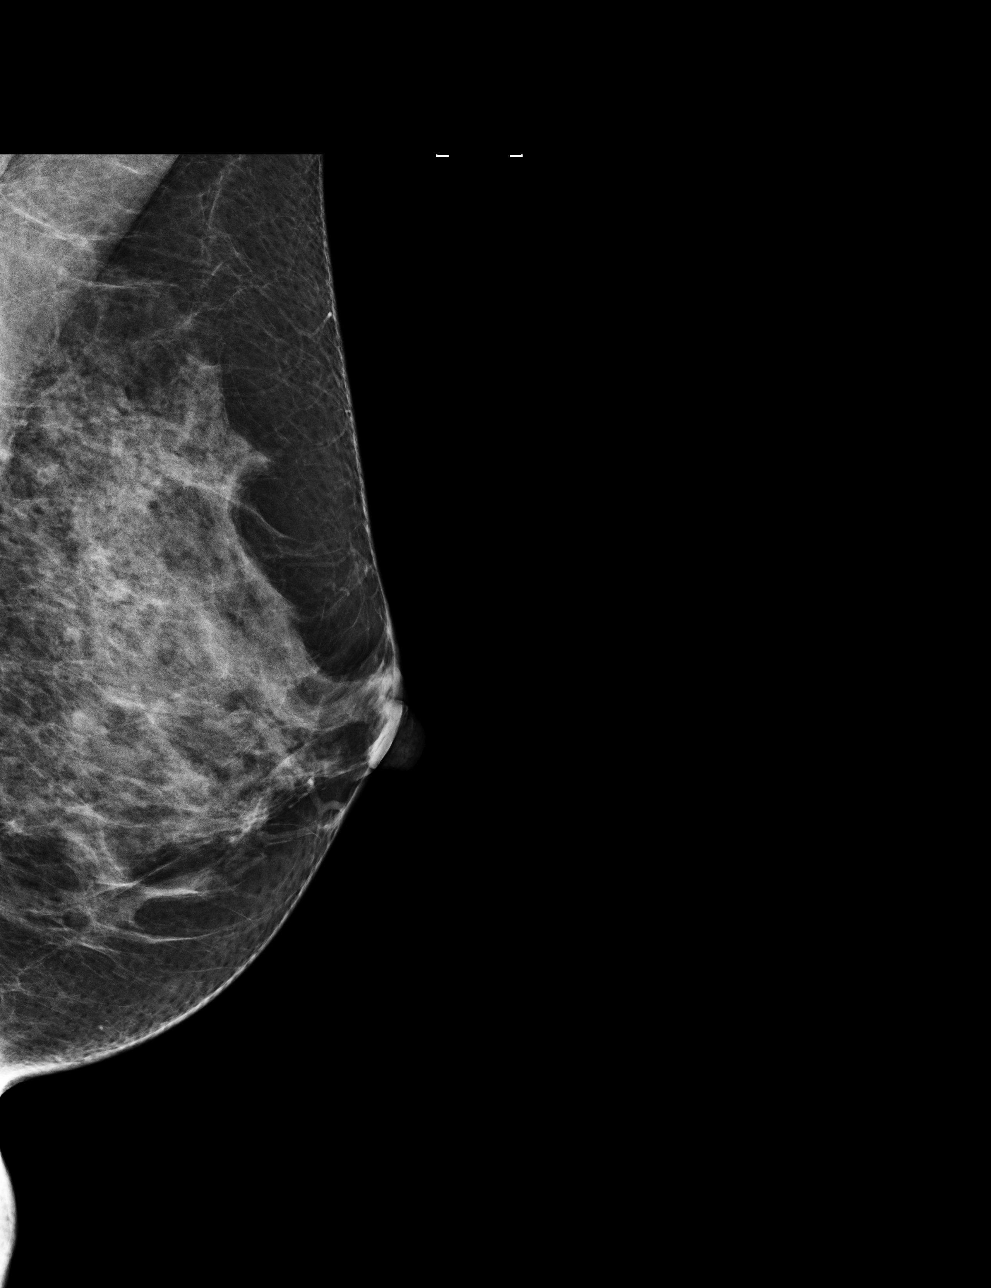

[4 of 4 positions shown; findings below may reference images not displayed]

ACR Breast Density Category c: The breasts are heterogeneously
dense, which may obscure small masses.
FINDINGS: In the right breast, a possible asymmetry warrants further
evaluation with spot compression views and possibly ultrasound. In
the left breast, no suspicious masses or malignant type
calcifications are identified. Images were processed with CAD.
IMPRESSION: Further evaluation is suggested for possible asymmetry in the right
breast.

RECOMMENDATION:
Diagnostic mammogram and possibly ultrasound of the right breast.
(Code:[id number])

The patient will be contacted regarding the findings, and additional
imaging will be scheduled.

BI-RADS CATEGORY  0: Incomplete. Need additional imaging evaluation
and/or prior mammograms for comparison.

## 2015-09-14 ENCOUNTER — Telehealth: Payer: Self-pay | Admitting: Gastroenterology

## 2015-09-14 MED ORDER — HYOSCYAMINE SULFATE 0.125 MG SL SUBL: 0.1250 mg | SUBLINGUAL_TABLET | SUBLINGUAL | Status: DC | PRN

## 2015-09-14 NOTE — Telephone Encounter (Signed)
She called tonight about colitis symptoms.  Has been on prednisone 40mg  daily for past 4 days under direction of Dr. Collene Mares.  Not improving.  Crampy abd pains, continued bloody diarrhea.  I recommended she increase her prednisone to 60mg  a day and call Dr. Lorie Apley office in AM.  She does not want to go to hospital tonight.  I'm calling her in antispasm meds for now as well.

## 2015-09-26 ENCOUNTER — Other Ambulatory Visit: Payer: Self-pay

## 2015-09-26 DIAGNOSIS — Z1231 Encounter for screening mammogram for malignant neoplasm of breast: Secondary | ICD-10-CM

## 2015-10-07 ENCOUNTER — Ambulatory Visit
Admission: RE | Admit: 2015-10-07 | Discharge: 2015-10-07 | Disposition: A | Discharge status: Home / Self Care | Payer: BC Managed Care – PPO | Source: Ambulatory Visit

## 2015-10-07 DIAGNOSIS — Z1231 Encounter for screening mammogram for malignant neoplasm of breast: Secondary | ICD-10-CM

## 2015-10-10 ENCOUNTER — Other Ambulatory Visit: Payer: Self-pay | Admitting: Women's Health

## 2015-10-10 DIAGNOSIS — R928 Other abnormal and inconclusive findings on diagnostic imaging of breast: Secondary | ICD-10-CM

## 2015-10-14 ENCOUNTER — Ambulatory Visit (INDEPENDENT_AMBULATORY_CARE_PROVIDER_SITE_OTHER): Payer: BC Managed Care – PPO | Admitting: Women's Health

## 2015-10-14 ENCOUNTER — Encounter: Payer: Self-pay | Admitting: Women's Health

## 2015-10-14 VITALS — BP 100/68 | Ht 60.5 in | Wt 117.0 lb

## 2015-10-14 DIAGNOSIS — Z01419 Encounter for gynecological examination (general) (routine) without abnormal findings: Secondary | ICD-10-CM

## 2015-10-14 NOTE — Progress Notes (Signed)
DANIKAH STREBE [birth year] [id number]    History:    Presents for annual exam.  Monthly cycle/BTL. Normal Pap history. Recent mammogram needs ultrasound follow-up, has scheduled. 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 Long history of ulcerative colitis has a colonoscopy scheduled next month. Had been in remission for almost 3 years but has a recent flare, currently on prednisone greater than one month.   Past medical history, past surgical history, family history and social history were all reviewed and documented in the EPIC chart. 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 2 sons both doing well. Father skin cancer. Mother lymphoma died greater than 20 years ago.  ROS:  A ROS was performed and pertinent positives and negatives are included.  Exam:  Filed Vitals:   10/14/15 0921  BP: 100/68    General appearance:  Normal Thyroid:  Symmetrical, normal in size, without palpable masses or nodularity. Respiratory  Auscultation:  Clear without wheezing or rhonchi Cardiovascular  Auscultation:  Regular rate, without rubs, murmurs or gallops  Edema/varicosities:  Not grossly evident Abdominal  Soft,nontender, without masses, guarding or rebound.  Liver/spleen:  No organomegaly noted  Hernia:  None appreciated  Skin  Inspection:  Grossly normal   Breasts: Examined lying and sitting.     Right: Without masses, retractions, discharge or axillary adenopathy.     Left: Without masses, retractions, discharge or axillary adenopathy. Gentitourinary   Inguinal/mons:  Normal without inguinal adenopathy  External genitalia:  Normal  BUS/Urethra/Skene's glands:  Normal  Vagina:  Normal  Cervix:  Normal  Uterus:   normal in size, shape and contour.  Midline and mobile  Adnexa/parametria:     Rt: Without masses or tenderness.   Lt: Without masses or tenderness.  Anus and perineum: Normal  Digital rectal exam: Normal sphincter tone without palpated masses or  tenderness  Assessment/Plan:  45 y.o. MWF G2 P2  for annual exam with no GYN complaints.  Monthly cycle/BTL Ulcerative colitis-Dr. Collene Mares manages 2012 basal skin cancer-annual skin checks  Plan: SBE's, keep scheduled ultrasound appointment for mammogram follow-up. 3-D tomography reviewed and encouraged history of dense breast. Exercise, calcium rich diet, vitamin D 2000 daily encouraged. Pap with HR HPV typing, new screening guidelines reviewed.  Huel Cote WHNP, 10:10 AM 10/14/2015

## 2015-10-14 NOTE — Addendum Note (Signed)
Addended by: Thurnell Garbe A on: 10/14/2015 02:37 PM   Modules accepted: Orders, SmartSet

## 2015-10-16 LAB — PAP, TP IMAGING W/ HPV RNA, RFLX HPV TYPE 16,18/45: HPV mRNA, High Risk: NOT DETECTED

## 2015-10-20 ENCOUNTER — Ambulatory Visit
Admission: RE | Admit: 2015-10-20 | Discharge: 2015-10-20 | Disposition: A | Discharge status: Home / Self Care | Payer: BC Managed Care – PPO | Source: Ambulatory Visit | Attending: Women's Health | Admitting: Women's Health

## 2015-10-20 ENCOUNTER — Encounter: Payer: Self-pay | Admitting: Women's Health

## 2015-10-20 DIAGNOSIS — R928 Other abnormal and inconclusive findings on diagnostic imaging of breast: Secondary | ICD-10-CM

## 2016-12-01 ENCOUNTER — Other Ambulatory Visit: Payer: Self-pay | Admitting: Women's Health

## 2016-12-01 DIAGNOSIS — Z1231 Encounter for screening mammogram for malignant neoplasm of breast: Secondary | ICD-10-CM

## 2016-12-06 ENCOUNTER — Encounter: Payer: Self-pay | Admitting: Women's Health

## 2016-12-22 ENCOUNTER — Ambulatory Visit: Payer: BC Managed Care – PPO

## 2017-01-06 ENCOUNTER — Ambulatory Visit
Admission: RE | Admit: 2017-01-06 | Discharge: 2017-01-06 | Disposition: A | Discharge status: Home / Self Care | Payer: BC Managed Care – PPO | Source: Ambulatory Visit | Attending: Women's Health | Admitting: Women's Health

## 2017-01-06 DIAGNOSIS — Z1231 Encounter for screening mammogram for malignant neoplasm of breast: Secondary | ICD-10-CM

## 2017-01-09 ENCOUNTER — Encounter: Payer: Self-pay | Admitting: Women's Health

## 2018-03-23 ENCOUNTER — Other Ambulatory Visit: Payer: Self-pay | Admitting: Women's Health

## 2018-03-23 DIAGNOSIS — Z1231 Encounter for screening mammogram for malignant neoplasm of breast: Secondary | ICD-10-CM

## 2018-04-25 ENCOUNTER — Ambulatory Visit
Admission: RE | Admit: 2018-04-25 | Discharge: 2018-04-25 | Disposition: A | Discharge status: Home / Self Care | Payer: BC Managed Care – PPO | Source: Ambulatory Visit | Attending: Women's Health | Admitting: Women's Health

## 2018-04-25 DIAGNOSIS — Z1231 Encounter for screening mammogram for malignant neoplasm of breast: Secondary | ICD-10-CM

## 2018-04-26 ENCOUNTER — Other Ambulatory Visit: Payer: Self-pay | Admitting: Women's Health

## 2018-04-26 DIAGNOSIS — R928 Other abnormal and inconclusive findings on diagnostic imaging of breast: Secondary | ICD-10-CM

## 2018-05-02 ENCOUNTER — Ambulatory Visit
Admission: RE | Admit: 2018-05-02 | Discharge: 2018-05-02 | Disposition: A | Discharge status: Home / Self Care | Payer: BC Managed Care – PPO | Source: Ambulatory Visit | Attending: Women's Health | Admitting: Women's Health

## 2018-05-02 DIAGNOSIS — R928 Other abnormal and inconclusive findings on diagnostic imaging of breast: Secondary | ICD-10-CM

## 2018-05-16 ENCOUNTER — Encounter: Payer: Self-pay | Admitting: Women's Health

## 2018-05-16 ENCOUNTER — Ambulatory Visit: Payer: BC Managed Care – PPO | Admitting: Women's Health

## 2018-05-16 VITALS — BP 102/78 | Ht 60.25 in | Wt 123.0 lb

## 2018-05-16 DIAGNOSIS — Z01419 Encounter for gynecological examination (general) (routine) without abnormal findings: Secondary | ICD-10-CM

## 2018-05-16 NOTE — Patient Instructions (Signed)

## 2018-05-16 NOTE — Progress Notes (Signed)
Angel Dean April 20, 1971 [id number]    History:    Presents for annual exam.  Regular monthly 2 to 3 days cycle with occasional mucus spotting day 10 of cycle.  BTL.  Normal Pap and mammogram history, this year had benign appearing cysts on ultrasound.  5/18 colonoscopy history of ulcerative colitis in remission.  Basal cell skin cancer has annual skin checks.  Past medical history, past surgical history, family history and social history were all reviewed and documented in the EPIC chart.  Father skin cancer, mother deceased from [place] at age 73.  2 teenage sons both doing well.  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  ROS:  A ROS was performed and pertinent positives and negatives are included.  Exam:  Vitals:   05/16/18 0947  BP: 102/78  Weight: 123 lb (55.8 kg)  Height: 5' 0.25" (1.53 m)   Body mass index is 23.82 kg/m.   General appearance:  Normal Thyroid:  Symmetrical, normal in size, without palpable masses or nodularity. Respiratory  Auscultation:  Clear without wheezing or rhonchi Cardiovascular  Auscultation:  Regular rate, without rubs, murmurs or gallops  Edema/varicosities:  Not grossly evident Abdominal  Soft,nontender, without masses, guarding or rebound.  Liver/spleen:  No organomegaly noted  Hernia:  None appreciated  Skin  Inspection:  Grossly normal   Breasts: Examined lying and sitting.     Right: Without masses, retractions, discharge or axillary adenopathy.     Left: Without masses, retractions, discharge or axillary adenopathy. Gentitourinary   Inguinal/mons:  Normal without inguinal adenopathy  External genitalia:  Normal  BUS/Urethra/Skene's glands:  Normal  Vagina:  Normal  Cervix:  Normal  Uterus:  normal in size, shape and contour.  Midline and mobile  Adnexa/parametria:     Rt: Without masses or tenderness.   Lt: Without masses or tenderness.  Anus and perineum: Normal  Digital rectal exam: Normal  sphincter tone without palpated masses or tenderness  Assessment/Plan:  47 y.o. MWF G2, P2 for annual exam with no complaints.  Monthly cycle/BTL Ulcerative colitis-Dr. Collene Mares meds and labs History of basal skin cell skin cancer annual skin checks  Plan: SBE's, annual screening mammogram, calcium rich foods, vitamin D 2000 daily regular exercise encouraged.  Instructed to call if midcycle mucus light spotting changes or increases will proceed with sonohysterogram.  Come fasting next year we will check a lipid panel.  Pap normal 2017, new screening guidelines reviewed.   Bulger, 10:26 AM 05/16/2018

## 2020-11-01 ENCOUNTER — Other Ambulatory Visit: Payer: Self-pay | Admitting: Obstetrics and Gynecology

## 2020-11-01 DIAGNOSIS — N6310 Unspecified lump in the right breast, unspecified quadrant: Secondary | ICD-10-CM

## 2020-12-10 ENCOUNTER — Ambulatory Visit
Admission: RE | Admit: 2020-12-10 | Discharge: 2020-12-10 | Disposition: A | Discharge status: Home / Self Care | Payer: BC Managed Care – PPO | Source: Ambulatory Visit | Attending: Obstetrics and Gynecology | Admitting: Obstetrics and Gynecology

## 2020-12-10 ENCOUNTER — Other Ambulatory Visit: Payer: Self-pay

## 2020-12-10 ENCOUNTER — Ambulatory Visit: Payer: BC Managed Care – PPO

## 2020-12-10 DIAGNOSIS — N6310 Unspecified lump in the right breast, unspecified quadrant: Secondary | ICD-10-CM
